# Patient Record
Sex: Male | Born: 2000 | Race: Black or African American | Hispanic: No | Marital: Single | Smoking: Never smoker
Health system: Southern US, Community
[De-identification: ages and names within clinical notes are randomized; demographics above are authoritative.]

## PROBLEM LIST (undated history)

## (undated) DIAGNOSIS — I10 Essential (primary) hypertension: Secondary | ICD-10-CM

## (undated) DIAGNOSIS — M79673 Pain in unspecified foot: Secondary | ICD-10-CM

## (undated) DIAGNOSIS — M25569 Pain in unspecified knee: Secondary | ICD-10-CM

## (undated) HISTORY — DX: Essential (primary) hypertension: I10

## (undated) HISTORY — DX: Pain in unspecified foot: M79.673

## (undated) HISTORY — DX: Pain in unspecified knee: M25.569

---
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
## 2020-02-20 ENCOUNTER — Other Ambulatory Visit: Payer: Self-pay | Admitting: Adult Medicine

## 2020-02-20 DIAGNOSIS — S060X9A Concussion with loss of consciousness of unspecified duration, initial encounter: Secondary | ICD-10-CM

## 2020-02-20 DIAGNOSIS — M542 Cervicalgia: Secondary | ICD-10-CM

## 2020-03-09 ENCOUNTER — Other Ambulatory Visit: Payer: Self-pay

## 2020-04-27 ENCOUNTER — Ambulatory Visit: Payer: Self-pay | Admitting: Family

## 2021-02-22 ENCOUNTER — Encounter: Payer: Self-pay | Admitting: Family

## 2021-02-22 ENCOUNTER — Ambulatory Visit (INDEPENDENT_AMBULATORY_CARE_PROVIDER_SITE_OTHER): Payer: 59 | Admitting: Family

## 2021-02-22 VITALS — BP 138/92 | HR 74 | Temp 97.3°F | Ht 74.0 in | Wt 174.0 lb

## 2021-02-22 DIAGNOSIS — Z7689 Persons encountering health services in other specified circumstances: Secondary | ICD-10-CM | POA: Diagnosis not present

## 2021-02-22 DIAGNOSIS — Z23 Encounter for immunization: Secondary | ICD-10-CM

## 2021-02-22 DIAGNOSIS — I10 Essential (primary) hypertension: Secondary | ICD-10-CM | POA: Diagnosis not present

## 2021-02-22 NOTE — Progress Notes (Signed)
Provider: Marlowe Sax Holland   No primary care provider on file.  No care team member to display  Extended Emergency Contact Information Primary Emergency Contact: [facility] Address: [address]          [place],  [place] Phone: [phone] Relation: Mother Secondary Emergency Contact: Gregory Holland Mobile Phone: [phone] Relation: Father Interpreter needed? No  Code Status:  Full Code  Goals of care: Advanced Directive information No flowsheet data found.   Chief Complaint  Patient presents with   Establish Care    New patient establish care. Elevated blood pressure and flu vaccine toady     HPI:  Pt is a 20 y.o. male seen today to establish care at [facility] for medical management of chronic diseases.He is a full time student at [employer].states Blood pressure was recent high 150/90.denies any headache,dizziness,vision changes,fatigue,chest tightness,palpitation,chest pain or shortness of breath.    Overall has been health does not take any medication on a regular basis. Does exercise by lifting weights though has been busy at school not exercising as usual.  He is due for Influenza vaccine. Also due for 2nd booster vaccine for COVID-19.discussed getting vaccine at his pharmacy.  States had patchy area on left neck which has resolved.  He drinks 2-3 beers / Tequila sometimes per week but not often.He does not smoke or use any illicit drugs.  Has had some sensation all over the body sometimes but none today.     Past Medical History:  Diagnosis Date   Foot pain    Per [facility] New Patient Packet   High blood pressure    Per [facility] New Patient Packet   Knee pain    Per [facility] New Patient packet   History reviewed. No pertinent surgical history.  Allergies  Allergen Reactions   Amoxicillin-Pot Clavulanate Rash    Allergies as of 02/22/2021       Reactions   Amoxicillin-pot Clavulanate Rash        Medication List        Accurate as of  February 22, 2021  1:29 PM. If you have any questions, ask your nurse or doctor.          ibuprofen 200 MG tablet Commonly known as: ADVIL Take 200 mg by mouth as needed.   multivitamin tablet Take 1 tablet by mouth daily.        Review of Systems  Constitutional:  Negative for appetite change, chills, fatigue, fever and unexpected weight change.  HENT:  Negative for congestion, dental problem, ear discharge, ear pain, facial swelling, hearing loss, nosebleeds, postnasal drip, rhinorrhea, sinus pressure, sinus pain, sneezing, sore throat, tinnitus and trouble swallowing.   Eyes:  Negative for pain, discharge, redness, itching and visual disturbance.  Respiratory:  Negative for cough, chest tightness, shortness of breath and wheezing.   Cardiovascular:  Negative for chest pain, palpitations and leg swelling.  Gastrointestinal:  Negative for abdominal distention, abdominal pain, blood in stool, constipation, diarrhea, nausea and vomiting.  Endocrine: Negative for cold intolerance, heat intolerance, polydipsia, polyphagia and polyuria.  Genitourinary:  Negative for difficulty urinating, dysuria, flank pain, frequency and urgency.  Musculoskeletal:  Negative for arthralgias, back pain, gait problem, joint swelling, myalgias, neck pain and neck stiffness.       Hx of right knee and foot pain when he used to play sports but has resolved.   Skin:  Negative for color change, pallor, rash and wound.       Reports  left neck patchy rash but has resolved   Neurological:  Negative for dizziness, syncope, speech difficulty, weakness, light-headedness, numbness and headaches.       Sensation all over the body but none today   Hematological:  Does not bruise/bleed easily.  Psychiatric/Behavioral:  Negative for agitation, behavioral problems, confusion, hallucinations, self-injury, sleep disturbance and suicidal ideas. The patient is not nervous/anxious.    Immunization History  Administered  Date(s) Administered   DTaP 03/21/1999, 05/27/1999, 07/15/1999, 07/16/2000, 01/28/2003   H1N1 03/16/2008   HPV Quadrivalent 05/10/2011, 07/17/2011, 12/18/2011   Hepatitis A, Ped/Adol-2 Dose 04/23/2006, 04/29/2007   Hepatitis B, ped/adol 05-May-1999, 02/15/1999, 10/21/1999   HiB (PRP-T) 03/21/1999, 05/27/1999, 07/15/1999, 01/23/2000   IPV 03/21/1999, 05/27/1999, 07/15/1999, 01/28/2003   Influenza Nasal 02/10/2010, 05/09/2012, 04/30/2013   Influenza Split 01/27/2015   Influenza,inj,Quad PF,6+ Mos 02/04/2016, 02/05/2017   MMR 01/23/2000, 01/28/2003   Meningococcal Conjugate 05/02/2010, 01/27/2015   PFIZER(Purple Top)SARS-COV-2 Vaccination 07/29/2019, 08/15/2019, 04/15/2020   Pneumococcal Conjugate-13 01/23/2000, 07/16/2000   Tdap 02/03/2009, 02/02/2020   Varicella 01/23/2000, 04/23/2006   Pertinent  Health Maintenance Due  Topic Date Due   INFLUENZA VACCINE  12/13/2020   Fall Risk  02/22/2021  Falls in the past year? 0  Number falls in past yr: 0  Injury with Fall? 0  Risk for fall due to : No Fall Risks  Follow up Falls evaluation completed   Functional Status Survey:    Vitals:   02/22/21 1318  BP: (!) 138/92  Pulse: 74  Temp: (!) 97.3 F (36.3 C)  TempSrc: Temporal  SpO2: 98%  Weight: 174 lb (78.9 kg)  Height: _0  (1.88 m)   Body mass index is 22.34 kg/m. Physical Exam Vitals reviewed.  Constitutional:      General: He is not in acute distress.    Appearance: Normal appearance. He is normal weight. He is not ill-appearing or diaphoretic.  HENT:     Head: Normocephalic.     Right Ear: Tympanic membrane, ear canal and external ear normal. There is no impacted cerumen.     Left Ear: Tympanic membrane, ear canal and external ear normal. There is no impacted cerumen.     Nose: Nose normal. No congestion or rhinorrhea.     Mouth/Throat:     Mouth: Mucous membranes are moist.     Pharynx: Oropharynx is clear. No oropharyngeal exudate or posterior oropharyngeal  erythema.  Eyes:     General: No scleral icterus.       Right eye: No discharge.        Left eye: No discharge.     Extraocular Movements: Extraocular movements intact.     Conjunctiva/sclera: Conjunctivae normal.     Pupils: Pupils are equal, round, and reactive to light.  Neck:     Vascular: No carotid bruit.  Cardiovascular:     Rate and Rhythm: Normal rate and regular rhythm.     Pulses: Normal pulses.     Heart sounds: Normal heart sounds. No murmur heard.   No friction rub. No gallop.  Pulmonary:     Effort: Pulmonary effort is normal. No respiratory distress.     Breath sounds: Normal breath sounds. No wheezing, rhonchi or rales.  Chest:     Chest wall: No tenderness.  Abdominal:     General: Bowel sounds are normal. There is no distension.     Palpations: Abdomen is soft. There is no mass.     Tenderness: There is no abdominal tenderness. There is no right  CVA tenderness, left CVA tenderness, guarding or rebound.  Musculoskeletal:        General: No swelling or tenderness. Normal range of motion.     Cervical back: Normal range of motion. No rigidity or tenderness.     Right lower leg: No edema.     Left lower leg: No edema.  Lymphadenopathy:     Cervical: No cervical adenopathy.  Skin:    General: Skin is warm and dry.     Coloration: Skin is not pale.     Findings: No bruising, erythema, lesion or rash.  Neurological:     Mental Status: He is alert and oriented to person, place, and time.     Cranial Nerves: No cranial nerve deficit.     Sensory: No sensory deficit.     Motor: No weakness.     Coordination: Coordination normal.     Gait: Gait normal.  Psychiatric:        Mood and Affect: Mood normal.        Speech: Speech normal.        Behavior: Behavior normal.        Thought Content: Thought content normal.        Judgment: Judgment normal.    Labs reviewed: No results for input(s): NA, K, CL, CO2, GLUCOSE, BUN, CREATININE, CALCIUM, MG, PHOS in the last  8760 hours. No results for input(s): AST, ALT, ALKPHOS, BILITOT, PROT, ALBUMIN in the last 8760 hours. No results for input(s): WBC, NEUTROABS, HGB, HCT, MCV, PLT in the last 8760 hours. No results found for: TSH No results found for: HGBA1C No results found for: CHOL, HDL, LDLCALC, LDLDIRECT, TRIG, CHOLHDL  Significant Diagnostic Results in last 30 days:  No results found.  Assessment/Plan There are no diagnoses linked to this encounter.   Family/ staff Communication: Reviewed plan of care with patient verbalized understanding.  Labs/tests ordered:   1. Encounter to establish care Immunization reviewed up to date except for 2nd COVID-19 booster and Influenza vaccine.Recommended fasting labs to be done today came fasting.  - CBC with Differential/Platelet - CMP with eGFR([facility]) - Lipid Panel  2. Essential hypertension Reports elevated reading recent but has improved today. Will have him monitor B/p at home then notify provider if B/p consistently > 140/90  Additional DASH Eating plan Education information provided on AVS  - CBC with Differential/Platelet - CMP with eGFR([facility]) - Lipid Panel - TSH  3. Need for influenza vaccination Afebrile. Flut shot administered by Annitta Jersey CMA no acute reaction reported.   - Flu Vaccine QUAD 6+ mos PF IM (Fluarix Quad PF)   Next Appointment : one year for Annual Physical examination with fasting labs.  Sandrea Hughs, NP

## 2021-02-22 NOTE — Patient Instructions (Addendum)
Labs done today will call you with results.  -  Please check Blood pressure at home and record on log provided and notify provider if B/p > 140/90 for three consecutive readings   DASH Eating Plan DASH stands for Dietary Approaches to Stop Hypertension. The DASH eating plan is a healthy eating plan that has been shown to: Reduce high blood pressure (hypertension). Reduce your risk for type 2 diabetes, heart disease, and stroke. Help with weight loss. What are tips for following this plan? Reading food labels Check food labels for the amount of salt (sodium) per serving. Choose foods with less than 5 percent of the Daily Value of sodium. Generally, foods with less than 300 milligrams (mg) of sodium per serving fit into this eating plan. To find whole grains, look for the word "whole" as the first word in the ingredient list. Shopping Buy products labeled as "low-sodium" or "no salt added." Buy fresh foods. Avoid canned foods and pre-made or frozen meals. Cooking Avoid adding salt when cooking. Use salt-free seasonings or herbs instead of table salt or sea salt. Check with your health care provider or pharmacist before using salt substitutes. Do not fry foods. Cook foods using healthy methods such as baking, boiling, grilling, roasting, and broiling instead. Cook with heart-healthy oils, such as olive, canola, avocado, soybean, or sunflower oil. Meal planning  Eat a balanced diet that includes: 4 or more servings of fruits and 4 or more servings of vegetables each day. Try to fill one-half of your plate with fruits and vegetables. 6-8 servings of whole grains each day. Less than 6 oz (170 g) of lean meat, poultry, or fish each day. A 3-oz (85-g) serving of meat is about the same size as a deck of cards. One egg equals 1 oz (28 g). 2-3 servings of low-fat dairy each day. One serving is 1 cup (237 mL). 1 serving of nuts, seeds, or beans 5 times each week. 2-3 servings of heart-healthy fats.  Healthy fats called omega-3 fatty acids are found in foods such as walnuts, flaxseeds, fortified milks, and eggs. These fats are also found in cold-water fish, such as sardines, salmon, and mackerel. Limit how much you eat of: Canned or prepackaged foods. Food that is high in trans fat, such as some fried foods. Food that is high in saturated fat, such as fatty meat. Desserts and other sweets, sugary drinks, and other foods with added sugar. Full-fat dairy products. Do not salt foods before eating. Do not eat more than 4 egg yolks a week. Try to eat at least 2 vegetarian meals a week. Eat more home-cooked food and less restaurant, buffet, and fast food. Lifestyle When eating at a restaurant, ask that your food be prepared with less salt or no salt, if possible. If you drink alcohol: Limit how much you use to: 0-1 drink a day for women who are not pregnant. 0-2 drinks a day for men. Be aware of how much alcohol is in your drink. In the [place] one drink equals one 12 oz bottle of beer (355 mL), one 5 oz glass of wine (148 mL), or one 1 oz glass of hard liquor (44 mL). General information Avoid eating more than 2,300 mg of salt a day. If you have hypertension, you may need to reduce your sodium intake to 1,500 mg a day. Work with your health care provider to maintain a healthy body weight or to lose weight. Ask what an ideal weight is for you. Get at  least 30 minutes of exercise that causes your heart to beat faster (aerobic exercise) most days of the week. Activities may include walking, swimming, or biking. Work with your health care provider or dietitian to adjust your eating plan to your individual calorie needs. What foods should I eat? Fruits All fresh, dried, or frozen fruit. Canned fruit in natural juice (without added sugar). Vegetables Fresh or frozen vegetables (raw, steamed, roasted, or grilled). Low-sodium or reduced-sodium tomato and vegetable juice. Low-sodium or reduced-sodium  tomato sauce and tomato paste. Low-sodium or reduced-sodium canned vegetables. Grains Whole-grain or whole-wheat bread. Whole-grain or whole-wheat pasta. Klinker rice. Orpah Cobb. Bulgur. Whole-grain and low-sodium cereals. Pita bread. Low-fat, low-sodium crackers. Whole-wheat flour tortillas. Meats and other proteins Skinless chicken or [place]. Ground chicken or [place]. Pork with fat trimmed off. Fish and seafood. Egg whites. Dried beans, peas, or lentils. Unsalted nuts, nut butters, and seeds. Unsalted canned beans. Lean cuts of beef with fat trimmed off. Low-sodium, lean precooked or cured meat, such as sausages or meat loaves. Dairy Low-fat (1%) or fat-free (skim) milk. Reduced-fat, low-fat, or fat-free cheeses. Nonfat, low-sodium ricotta or cottage cheese. Low-fat or nonfat yogurt. Low-fat, low-sodium cheese. Fats and oils Soft margarine without trans fats. Vegetable oil. Reduced-fat, low-fat, or light mayonnaise and salad dressings (reduced-sodium). Canola, safflower, olive, avocado, soybean, and sunflower oils. Avocado. Seasonings and condiments Herbs. Spices. Seasoning mixes without salt. Other foods Unsalted popcorn and pretzels. Fat-free sweets. The items listed above may not be a complete list of foods and beverages you can eat. Contact a dietitian for more information. What foods should I avoid? Fruits Canned fruit in a light or heavy syrup. Fried fruit. Fruit in cream or butter sauce. Vegetables Creamed or fried vegetables. Vegetables in a cheese sauce. Regular canned vegetables (not low-sodium or reduced-sodium). Regular canned tomato sauce and paste (not low-sodium or reduced-sodium). Regular tomato and vegetable juice (not low-sodium or reduced-sodium). Rosita Fire. Olives. Grains Baked goods made with fat, such as croissants, muffins, or some breads. Dry pasta or rice meal packs. Meats and other proteins Fatty cuts of meat. Ribs. Fried meat. Tomasa Blase. Bologna, salami, and other  precooked or cured meats, such as sausages or meat loaves. Fat from the back of a pig (fatback). Bratwurst. Salted nuts and seeds. Canned beans with added salt. Canned or smoked fish. Whole eggs or egg yolks. Chicken or [place] with skin. Dairy Whole or 2% milk, cream, and half-and-half. Whole or full-fat cream cheese. Whole-fat or sweetened yogurt. Full-fat cheese. Nondairy creamers. Whipped toppings. Processed cheese and cheese spreads. Fats and oils Butter. Stick margarine. Lard. Shortening. Ghee. Bacon fat. Tropical oils, such as coconut, palm kernel, or palm oil. Seasonings and condiments Onion salt, garlic salt, seasoned salt, table salt, and sea salt. Worcestershire sauce. Tartar sauce. Barbecue sauce. Teriyaki sauce. Soy sauce, including reduced-sodium. Steak sauce. Canned and packaged gravies. Fish sauce. Oyster sauce. Cocktail sauce. Store-bought horseradish. Ketchup. Mustard. Meat flavorings and tenderizers. Bouillon cubes. Hot sauces. Pre-made or packaged marinades. Pre-made or packaged taco seasonings. Relishes. Regular salad dressings. Other foods Salted popcorn and pretzels. The items listed above may not be a complete list of foods and beverages you should avoid. Contact a dietitian for more information. Where to find more information National Heart, Lung, and Blood Institute: PopSteam.is American Heart Association: www.heart.org Academy of Nutrition and Dietetics: www.eatright.org National Kidney Foundation: www.kidney.org Summary The DASH eating plan is a healthy eating plan that has been shown to reduce high blood pressure (hypertension). It may also reduce your  risk for type 2 diabetes, heart disease, and stroke. When on the DASH eating plan, aim to eat more fresh fruits and vegetables, whole grains, lean proteins, low-fat dairy, and heart-healthy fats. With the DASH eating plan, you should limit salt (sodium) intake to 2,300 mg a day. If you have hypertension, you may need  to reduce your sodium intake to 1,500 mg a day. Work with your health care provider or dietitian to adjust your eating plan to your individual calorie needs. This information is not intended to replace advice given to you by your health care provider. Make sure you discuss any questions you have with your health care provider. Document Revised: 04/04/2019 Document Reviewed: 04/04/2019 Elsevier Patient Education  2022 [employer].

## 2021-02-23 LAB — COMPLETE METABOLIC PANEL WITH GFR
AG Ratio: 1.4 (calc) (ref 1.0–2.5)
ALT: 11 U/L (ref 9–46)
AST: 16 U/L (ref 10–40)
Albumin: 4 g/dL (ref 3.6–5.1)
Alkaline phosphatase (APISO): 38 U/L (ref 36–130)
BUN: 13 mg/dL (ref 7–25)
CO2: 29 mmol/L (ref 20–32)
Calcium: 9.3 mg/dL (ref 8.6–10.3)
Chloride: 103 mmol/L (ref 98–110)
Creat: 1.06 mg/dL (ref 0.60–1.24)
Globulin: 2.8 g/dL (calc) (ref 1.9–3.7)
Glucose, Bld: 80 mg/dL (ref 65–99)
Potassium: 4.7 mmol/L (ref 3.5–5.3)
Sodium: 140 mmol/L (ref 135–146)
Total Bilirubin: 0.6 mg/dL (ref 0.2–1.2)
Total Protein: 6.8 g/dL (ref 6.1–8.1)
eGFR: 102 mL/min/{1.73_m2} (ref >=60)

## 2021-02-23 LAB — CBC WITH DIFFERENTIAL/PLATELET
Absolute Monocytes: 164 cells/uL — ABNORMAL LOW (ref 200–950)
Basophils Absolute: 21 cells/uL (ref 0–200)
Basophils Relative: 0.8 %
Eosinophils Absolute: 31 cells/uL (ref 15–500)
Eosinophils Relative: 1.2 %
HCT: 43.5 % (ref 38.5–50.0)
Hemoglobin: 14.4 g/dL (ref 13.2–17.1)
Lymphs Abs: 1326 cells/uL (ref 850–3900)
MCH: 29.2 pg (ref 27.0–33.0)
MCHC: 33.1 g/dL (ref 32.0–36.0)
MCV: 88.2 fL (ref 80.0–100.0)
MPV: 9.3 fL (ref 7.5–12.5)
Monocytes Relative: 6.3 %
Neutro Abs: 1058 cells/uL — ABNORMAL LOW (ref 1500–7800)
Neutrophils Relative %: 40.7 %
Platelets: 399 10*3/uL (ref 140–400)
RBC: 4.93 10*6/uL (ref 4.20–5.80)
RDW: 11.3 % (ref 11.0–15.0)
Total Lymphocyte: 51 %
WBC: 2.6 10*3/uL — ABNORMAL LOW (ref 3.8–10.8)

## 2021-02-23 LAB — LIPID PANEL
Cholesterol: 123 mg/dL (ref <200)
HDL: 45 mg/dL (ref >=40)
LDL Cholesterol (Calc): 65 mg/dL (calc)
Non-HDL Cholesterol (Calc): 78 mg/dL (calc) (ref <130)
Total CHOL/HDL Ratio: 2.7 (calc) (ref <5.0)
Triglycerides: 47 mg/dL (ref <150)

## 2021-02-23 LAB — TSH: TSH: 0.88 mIU/L (ref 0.40–4.50)

## 2021-11-11 NOTE — Patient Instructions (Signed)
Please contact your local pharmacy, previous provider, or insurance carrier for vaccine/immunization records. Ensure that any procedures done outside of [facility] are faxed to [place] [phone] or you can sign release of records form at the front desk to keep your medical record updated.   Health Maintenance, Male Adopting a healthy lifestyle and getting preventive care are important in promoting health and wellness. Ask your health care provider about: The right schedule for you to have regular tests and exams. Things you can do on your own to prevent diseases and keep yourself healthy. What should I know about diet, weight, and exercise? Eat a healthy diet  Eat a diet that includes plenty of vegetables, fruits, low-fat dairy products, and lean protein. Do not eat a lot of foods that are high in solid fats, added sugars, or sodium. Maintain a healthy weight Body mass index (BMI) is a measurement that can be used to identify possible weight problems. It estimates body fat based on height and weight. Your health care provider can help determine your BMI and help you achieve or maintain a healthy weight. Get regular exercise Get regular exercise. This is one of the most important things you can do for your health. Most adults should: Exercise for at least 150 minutes each week. The exercise should increase your heart rate and make you sweat (moderate-intensity exercise). Do strengthening exercises at least twice a week. This is in addition to the moderate-intensity exercise. Spend less time sitting. Even light physical activity can be beneficial. Watch cholesterol and blood lipids Have your blood tested for lipids and cholesterol at 23 years of age, then have this test every 5 years. You may need to have your cholesterol levels checked more often if: Your lipid or cholesterol levels are high. You are older than 23 years of age. You are at high risk for heart  disease. What should I know about cancer screening? Many types of cancers can be detected early and may often be prevented. Depending on your health history and family history, you may need to have cancer screening at various ages. This may include screening for: Colorectal cancer. Prostate cancer. Skin cancer. Lung cancer. What should I know about heart disease, diabetes, and high blood pressure? Blood pressure and heart disease High blood pressure causes heart disease and increases the risk of stroke. This is more likely to develop in people who have high blood pressure readings or are overweight. Talk with your health care provider about your target blood pressure readings. Have your blood pressure checked: Every 3-5 years if you are 38-51 years of age. Every year if you are 33 years old or older. If you are between the ages of 31 and 8 and are a current or former smoker, ask your health care provider if you should have a one-time screening for abdominal aortic aneurysm (AAA). Diabetes Have regular diabetes screenings. This checks your fasting blood sugar level. Have the screening done: Once every three years after age 43 if you are at a normal weight and have a low risk for diabetes. More often and at a younger age if you are overweight or have a high risk for diabetes. What should I know about preventing infection? Hepatitis B If you have a higher risk for hepatitis B, you should be screened for this virus. Talk with your health care provider to find out if you are at risk for hepatitis B infection. Hepatitis C Blood testing is recommended for: Everyone  born from 1945 through 1965. Anyone with known risk factors for hepatitis C. Sexually transmitted infections (STIs) You should be screened each year for STIs, including gonorrhea and chlamydia, if: You are sexually active and are younger than 24 years of age. You are older than 24 years of age and your health care provider tells you  that you are at risk for this type of infection. Your sexual activity has changed since you were last screened, and you are at increased risk for chlamydia or gonorrhea. Ask your health care provider if you are at risk. Ask your health care provider about whether you are at high risk for HIV. Your health care provider may recommend a prescription medicine to help prevent HIV infection. If you choose to take medicine to prevent HIV, you should first get tested for HIV. You should then be tested every 3 months for as long as you are taking the medicine. Follow these instructions at home: Alcohol use Do not drink alcohol if your health care provider tells you not to drink. If you drink alcohol: Limit how much you have to 0-2 drinks a day. Know how much alcohol is in your drink. In the [place] one drink equals one 12 oz bottle of beer (355 mL), one 5 oz glass of wine (148 mL), or one 1 oz glass of hard liquor (44 mL). Lifestyle Do not use any products that contain nicotine or tobacco. These products include cigarettes, chewing tobacco, and vaping devices, such as e-cigarettes. If you need help quitting, ask your health care provider. Do not use street drugs. Do not share needles. Ask your health care provider for help if you need support or information about quitting drugs. General instructions Schedule regular health, dental, and eye exams. Stay current with your vaccines. Tell your health care provider if: You often feel depressed. You have ever been abused or do not feel safe at home. Summary Adopting a healthy lifestyle and getting preventive care are important in promoting health and wellness. Follow your health care provider's instructions about healthy diet, exercising, and getting tested or screened for diseases. Follow your health care provider's instructions on monitoring your cholesterol and blood pressure. This information is not intended to replace advice given to you by your health  care provider. Make sure you discuss any questions you have with your health care provider. Document Revised: 09/20/2020 Document Reviewed: 09/20/2020 Elsevier Patient Education  2023 Elsevier Inc.  

## 2021-11-14 ENCOUNTER — Encounter: Payer: Self-pay | Admitting: Family

## 2021-11-14 ENCOUNTER — Ambulatory Visit (INDEPENDENT_AMBULATORY_CARE_PROVIDER_SITE_OTHER): Payer: 59 | Admitting: Family

## 2021-11-14 VITALS — BP 150/80 | HR 71 | Temp 97.7°F | Resp 18 | Ht 74.0 in | Wt 178.0 lb

## 2021-11-14 DIAGNOSIS — Z13220 Encounter for screening for lipoid disorders: Secondary | ICD-10-CM

## 2021-11-14 DIAGNOSIS — Z0000 Encounter for general adult medical examination without abnormal findings: Secondary | ICD-10-CM | POA: Diagnosis not present

## 2021-11-14 DIAGNOSIS — Z1159 Encounter for screening for other viral diseases: Secondary | ICD-10-CM | POA: Diagnosis not present

## 2021-11-14 DIAGNOSIS — I10 Essential (primary) hypertension: Secondary | ICD-10-CM | POA: Diagnosis not present

## 2021-11-14 DIAGNOSIS — Z113 Encounter for screening for infections with a predominantly sexual mode of transmission: Secondary | ICD-10-CM | POA: Diagnosis not present

## 2021-11-14 MED ORDER — ACETAMINOPHEN 500 MG PO TABS: 500.0000 mg | ORAL_TABLET | Freq: Four times a day (QID) | ORAL | 0 refills | Status: AC | PRN

## 2021-11-14 MED ORDER — AMLODIPINE BESYLATE 5 MG PO TABS: 5.0000 mg | ORAL_TABLET | Freq: Every day | ORAL | 0 refills | Status: DC

## 2021-11-14 NOTE — Progress Notes (Signed)
Provider: Marlowe Sax Holland   Gregory Holland, Gregory Bucks, NP  Patient Care Team: Christion Leonhard, Gregory Bucks, NP as PCP - General (Family Medicine)  Extended Emergency Contact Information Primary Emergency Contact: [facility] Address: [address]          [place],  [place] Phone: [phone] Relation: Mother Secondary Emergency Contact: Burroughs,Clyde Mobile Phone: [phone] Relation: Father Interpreter needed? No  Code Status:  Full Code  Goals of care: Advanced Directive information    11/14/2021   11:52 AM  Advanced Directives  Does Patient Have a Medical Advance Directive? No  Would patient like information on creating a medical advance directive? No - Patient declined     Chief Complaint  Patient presents with   Annual Exam    PHYSICAL PT has BP log   Health Maintenance    Discuss the need for HIV Screening, and Hepatitis C Screening.   Immunizations    Discuss the need for [employer].    HPI:  Pt is a 21 y.o. male seen today for annual Physical examination.He denies any acute issues during visit. He brought his home blood pressure log readings ranging in 120's/70's with most readings ranging in the 130's/90's - 150's/80's.denies any headache,dizziness,vision changes,fatigue,chest tightness,palpitation,chest pain or shortness of breath.   Health maintenance:  Due for COVID-19 booster vaccine advised to get vaccine at the pharmacy. For HIV and hepatitis C screening.Agrees to get labs done with next lab work     Past Medical History:  Diagnosis Date   Foot pain    Per [facility] Patient Packet   High blood pressure    Per [facility] New Patient Packet   Knee pain    Per [facility] New Patient packet   History reviewed. No pertinent surgical history.  Allergies  Allergen Reactions   Amoxicillin-Pot Clavulanate Rash    Allergies as of 11/14/2021       Reactions   Amoxicillin-pot Clavulanate Rash        Medication List        Accurate as of November 14, 2021  2:57  PM. If you have any questions, ask your nurse or doctor.          STOP taking these medications    ibuprofen 200 MG tablet Commonly known as: ADVIL Stopped by: Sandrea Hughs, NP       TAKE these medications    acetaminophen 500 MG tablet Commonly known as: TYLENOL Take 1 tablet (500 mg total) by mouth every 6 (six) hours as needed. Started by: Sandrea Hughs, NP   multivitamin tablet Take 1 tablet by mouth daily.        Review of Systems  Constitutional:  Negative for appetite change, chills, fatigue, fever and unexpected weight change.  HENT:  Negative for congestion, dental problem, ear discharge, ear pain, facial swelling, hearing loss, nosebleeds, postnasal drip, rhinorrhea, sinus pressure, sinus pain, sneezing, sore throat, tinnitus and trouble swallowing.   Eyes:  Negative for pain, discharge, redness, itching and visual disturbance.  Respiratory:  Negative for cough, chest tightness, shortness of breath and wheezing.   Cardiovascular:  Negative for chest pain, palpitations and leg swelling.  Gastrointestinal:  Negative for abdominal distention, abdominal pain, blood in stool, constipation, diarrhea, nausea and vomiting.  Endocrine: Negative for cold intolerance, heat intolerance, polydipsia, polyphagia and polyuria.  Genitourinary:  Negative for difficulty urinating, dysuria, flank pain, frequency and urgency.  Musculoskeletal:  Negative for arthralgias, back pain, gait problem, joint swelling, myalgias, neck pain and  neck stiffness.  Skin:  Negative for color change, pallor, rash and wound.  Neurological:  Negative for dizziness, syncope, speech difficulty, weakness, light-headedness, numbness and headaches.  Hematological:  Does not bruise/bleed easily.  Psychiatric/Behavioral:  Negative for agitation, behavioral problems, confusion, hallucinations, self-injury, sleep disturbance and suicidal ideas. The patient is not nervous/anxious.     Immunization History   Administered Date(s) Administered   DTaP 03/21/1999, 05/27/1999, 07/15/1999, 07/16/2000, 01/28/2003   H1N1 03/16/2008   HPV Quadrivalent 05/10/2011, 07/17/2011, 12/18/2011   Hepatitis A, Ped/Adol-2 Dose 04/23/2006, 04/29/2007   Hepatitis B, ped/adol 04/26/1999, 02/15/1999, 10/21/1999   HiB (PRP-T) 03/21/1999, 05/27/1999, 07/15/1999, 01/23/2000   IPV 03/21/1999, 05/27/1999, 07/15/1999, 01/28/2003   Influenza Nasal 02/10/2010, 05/09/2012, 04/30/2013   Influenza Split 01/27/2015   Influenza,inj,Quad PF,6+ Mos 02/04/2016, 02/05/2017, 02/22/2021   MMR 01/23/2000, 01/28/2003   Meningococcal Conjugate 05/02/2010, 01/27/2015   PFIZER(Purple Top)SARS-COV-2 Vaccination 07/29/2019, 08/15/2019, 04/15/2020, 02/26/2021   Pneumococcal Conjugate-13 01/23/2000, 07/16/2000   Tdap 02/03/2009, 02/02/2020   Varicella 01/23/2000, 04/23/2006   Pertinent  Health Maintenance Due  Topic Date Due   INFLUENZA VACCINE  12/13/2021      02/22/2021    1:18 PM 11/14/2021   11:53 AM  [place] in the past year? 0 0  Was there an injury with Fall? 0 0  Fall Risk Category Calculator 0 0  Fall Risk Category Low Low  Patient Fall Risk Level Low fall risk Low fall risk  Patient at Risk for Falls Due to No Fall Risks No Fall Risks  Fall risk Follow up Falls evaluation completed Falls evaluation completed   Functional Status Survey:    Vitals:   11/14/21 1427  BP: (!) 150/80  Pulse: 71  Resp: 18  Temp: 97.7 F (36.5 C)  TempSrc: Temporal  SpO2: 98%  Weight: 178 lb (80.7 kg)  Height: _0  (1.88 m)   Body mass index is 22.85 kg/m. Physical Exam Vitals reviewed.  Constitutional:      General: He is not in acute distress.    Appearance: Normal appearance. He is normal weight. He is not ill-appearing or diaphoretic.  HENT:     Head: Normocephalic.     Right Ear: Tympanic membrane, ear canal and external ear normal. There is no impacted cerumen.     Left Ear: Tympanic membrane, ear canal and  external ear normal. There is no impacted cerumen.     Nose: Nose normal. No congestion or rhinorrhea.     Mouth/Throat:     Mouth: Mucous membranes are moist.     Pharynx: Oropharynx is clear. No oropharyngeal exudate or posterior oropharyngeal erythema.  Eyes:     General: No scleral icterus.       Right eye: No discharge.        Left eye: No discharge.     Extraocular Movements: Extraocular movements intact.     Conjunctiva/sclera: Conjunctivae normal.     Pupils: Pupils are equal, round, and reactive to light.  Neck:     Vascular: No carotid bruit.  Cardiovascular:     Rate and Rhythm: Normal rate and regular rhythm.     Pulses: Normal pulses.     Heart sounds: Normal heart sounds. No murmur heard.    No friction rub. No gallop.  Pulmonary:     Effort: Pulmonary effort is normal. No respiratory distress.     Breath sounds: Normal breath sounds. No wheezing, rhonchi or rales.  Chest:     Chest wall: No tenderness.  Abdominal:     General: Bowel sounds are normal. There is no distension.     Palpations: Abdomen is soft. There is no mass.     Tenderness: There is no abdominal tenderness. There is no right CVA tenderness, left CVA tenderness, guarding or rebound.  Musculoskeletal:        General: No swelling or tenderness. Normal range of motion.     Cervical back: Normal range of motion. No rigidity or tenderness.     Right lower leg: No edema.     Left lower leg: No edema.  Lymphadenopathy:     Cervical: No cervical adenopathy.  Skin:    General: Skin is warm and dry.     Coloration: Skin is not pale.     Findings: No bruising, erythema, lesion or rash.  Neurological:     Mental Status: He is alert and oriented to person, place, and time.     Cranial Nerves: No cranial nerve deficit.     Sensory: No sensory deficit.     Motor: No weakness.     Coordination: Coordination normal.     Gait: Gait normal.  Psychiatric:        Mood and Affect: Mood normal.        Speech:  Speech normal.        Behavior: Behavior normal.        Thought Content: Thought content normal.        Judgment: Judgment normal.     Labs reviewed: Recent Labs    02/22/21 1413  NA 140  K 4.7  CL 103  CO2 29  GLUCOSE 80  BUN 13  CREATININE 1.06  CALCIUM 9.3   Recent Labs    02/22/21 1413  AST 16  ALT 11  BILITOT 0.6  PROT 6.8   Recent Labs    02/22/21 1413  WBC 2.6*  NEUTROABS 1,058*  HGB 14.4  HCT 43.5  MCV 88.2  PLT 399   Lab Results  Component Value Date   TSH 0.88 02/22/2021   No results found for: "HGBA1C" Lab Results  Component Value Date   CHOL 123 02/22/2021   HDL 45 02/22/2021   LDLCALC 65 02/22/2021   TRIG 47 02/22/2021   CHOLHDL 2.7 02/22/2021    Significant Diagnostic Results in last 30 days:  No results found.  Assessment/Plan 1. Annual physical exam  Immunization reviewed due COVID-19 booster vaccine. Medication and labs reviewed patient counselled regarding yearly exam, prevention of dental and periodontal disease, diet, regular sustained exercise for at least 30 minutes x 3 /week,recommended schedule for routine labs.  2. Screen for STD (sexually transmitted disease) No high risk behaviors reported - HIV Antibody (routine testing w rflx); Future  3. Encounter for hepatitis C screening test for low risk patient Low risk - Hep C Antibody; Future  4. Essential hypertension His home blood pressure readings elevated We will start on amlodipine 5 mg tablet 1 by mouth daily -  Advised to check Blood pressure at home and record on log provided and notify provider if B/p > 140/90  -Follow-up in 2 weeks to evaluate blood pressure - Lipid panel; Future - TSH; Future - COMPLETE METABOLIC PANEL WITH GFR; Future - CBC with Differential/Platelet; Future  5. Screening for hyperlipidemia Dietary modification and exercise advised - Lipid panel; Future  Family/ staff Communication: Reviewed plan of care with patient verbalized  understanding  Labs/tests ordered:  - CBC with Differential/Platelet - CMP with eGFR([facility]) - TSH -  Lipid panel - Hep C Antibody - HIV Antibody (routine testing w rflx)   Next Appointment : Return for annual Physical examination. 2 weeks f/u blood pressure and fasting lab work .   Sandrea Hughs, NP

## 2021-12-05 ENCOUNTER — Ambulatory Visit (INDEPENDENT_AMBULATORY_CARE_PROVIDER_SITE_OTHER): Payer: 59 | Admitting: Family

## 2021-12-05 ENCOUNTER — Encounter: Payer: Self-pay | Admitting: Family

## 2021-12-05 DIAGNOSIS — I10 Essential (primary) hypertension: Secondary | ICD-10-CM | POA: Diagnosis not present

## 2021-12-05 DIAGNOSIS — Z13220 Encounter for screening for lipoid disorders: Secondary | ICD-10-CM | POA: Diagnosis not present

## 2021-12-05 DIAGNOSIS — Z1159 Encounter for screening for other viral diseases: Secondary | ICD-10-CM | POA: Diagnosis not present

## 2021-12-05 DIAGNOSIS — Z113 Encounter for screening for infections with a predominantly sexual mode of transmission: Secondary | ICD-10-CM | POA: Diagnosis not present

## 2021-12-05 LAB — CBC WITH DIFFERENTIAL/PLATELET
Absolute Monocytes: 183 cells/uL — ABNORMAL LOW (ref 200–950)
Basophils Relative: 1.4 %
Eosinophils Absolute: 183 cells/uL (ref 15–500)
Eosinophils Relative: 8.3 %
HCT: 44.2 % (ref 38.5–50.0)
Lymphs Abs: 1065 cells/uL (ref 850–3900)
MCHC: 33.7 g/dL (ref 32.0–36.0)
MPV: 9.7 fL (ref 7.5–12.5)
Neutro Abs: 739 cells/uL — ABNORMAL LOW (ref 1500–7800)
Neutrophils Relative %: 33.6 %
Platelets: 302 10*3/uL (ref 140–400)
RBC: 5.07 10*6/uL (ref 4.20–5.80)
RDW: 11.4 % (ref 11.0–15.0)
Total Lymphocyte: 48.4 %

## 2021-12-05 MED ORDER — AMLODIPINE BESYLATE 5 MG PO TABS: 5.0000 mg | ORAL_TABLET | Freq: Every day | ORAL | 1 refills | Status: DC

## 2021-12-05 NOTE — Progress Notes (Signed)
Provider: Marlowe Sax FNP-C  Jaimen Melone, Nelda Bucks, NP  Patient Care Team: Philena Obey, Nelda Bucks, NP as PCP - General (Family Medicine)  Extended Emergency Contact Information Primary Emergency Contact: [facility] Address: [address]          [place],  [place] Phone: [phone] Relation: Mother Secondary Emergency Contact: Mcquerry,Clyde Mobile Phone: [phone] Relation: Father Interpreter needed? No  Code Status:  Full Code  Goals of care: Advanced Directive information    12/05/2021    9:41 AM  Advanced Directives  Does Patient Have a Medical Advance Directive? No  Would patient like information on creating a medical advance directive? No - Patient declined     Chief Complaint  Patient presents with   Follow-up    2 week blood pressure check.    Labs     Fasting Labs.     HPI:  Pt is a 21 y.o. male seen today for an acute visit for 2 weeks follow up for high blood pressure. States blood pressure has improved since he was started on amlodipine 5 mg tablet daily.denies any side effects. He brought his B/p log to visit today readings in the 130's/80's. He denies denies any headache,dizziness,vision changes,fatigue,chest tightness,palpitation,chest pain or shortness of breath.     Past Medical History:  Diagnosis Date   Foot pain    Per [facility] New Patient Packet   High blood pressure    Per [facility] New Patient Packet   Knee pain    Per [facility] New Patient packet   History reviewed. No pertinent surgical history.  Allergies  Allergen Reactions   Amoxicillin-Pot Clavulanate Rash    Outpatient Encounter Medications as of 12/05/2021  Medication Sig   acetaminophen (TYLENOL) 500 MG tablet Take 1 tablet (500 mg total) by mouth every 6 (six) hours as needed.   amLODipine (NORVASC) 5 MG tablet Take 1 tablet (5 mg total) by mouth daily.   Multiple Vitamin (MULTIVITAMIN) tablet Take 1 tablet by mouth daily.   No facility-administered encounter medications on file as of  12/05/2021.    Review of Systems  Constitutional:  Negative for appetite change, chills, fatigue, fever and unexpected weight change.  Eyes:  Negative for pain, discharge, redness, itching and visual disturbance.  Respiratory:  Negative for cough, chest tightness, shortness of breath and wheezing.   Cardiovascular:  Negative for chest pain, palpitations and leg swelling.  Gastrointestinal:  Negative for abdominal distention, abdominal pain, diarrhea, nausea and vomiting.  Endocrine: Negative for cold intolerance, heat intolerance, polydipsia, polyphagia and polyuria.  Genitourinary:  Negative for difficulty urinating, dysuria, flank pain, frequency and urgency.  Musculoskeletal:  Negative for arthralgias, back pain, gait problem, joint swelling, myalgias, neck pain and neck stiffness.  Skin:  Negative for color change, pallor, rash and wound.  Neurological:  Negative for dizziness, syncope, speech difficulty, weakness, light-headedness, numbness and headaches.  Hematological:  Does not bruise/bleed easily.  Psychiatric/Behavioral:  Negative for agitation, behavioral problems, confusion, hallucinations, self-injury, sleep disturbance and suicidal ideas. The patient is not nervous/anxious.     Immunization History  Administered Date(s) Administered   DTaP 03/21/1999, 05/27/1999, 07/15/1999, 07/16/2000, 01/28/2003   H1N1 03/16/2008   HPV Quadrivalent 05/10/2011, 07/17/2011, 12/18/2011   Hepatitis A, Ped/Adol-2 Dose 04/23/2006, 04/29/2007   Hepatitis B, ped/adol 01/03/99, 02/15/1999, 10/21/1999   HiB (PRP-T) 03/21/1999, 05/27/1999, 07/15/1999, 01/23/2000   IPV 03/21/1999, 05/27/1999, 07/15/1999, 01/28/2003   Influenza Nasal 02/10/2010, 05/09/2012, 04/30/2013   Influenza Split 01/27/2015   Influenza,inj,Quad PF,6+ Mos 02/04/2016, 02/05/2017, 02/22/2021  MMR 01/23/2000, 01/28/2003   Meningococcal Conjugate 05/02/2010, 01/27/2015   PFIZER(Purple Top)SARS-COV-2 Vaccination 07/29/2019,  08/15/2019, 04/15/2020, 02/26/2021   Pneumococcal Conjugate-13 01/23/2000, 07/16/2000   Tdap 02/03/2009, 02/02/2020   Varicella 01/23/2000, 04/23/2006   Pertinent  Health Maintenance Due  Topic Date Due   INFLUENZA VACCINE  12/13/2021      02/22/2021    1:18 PM 11/14/2021   11:53 AM 12/05/2021    9:41 AM  [place] in the past year? 0 0 0  Was there an injury with Fall? 0 0 0  Fall Risk Category Calculator 0 0 0  Fall Risk Category Low Low Low  Patient Fall Risk Level Low fall risk Low fall risk Low fall risk  Patient at Risk for Falls Due to No Fall Risks No Fall Risks No Fall Risks  Fall risk Follow up Falls evaluation completed Falls evaluation completed Falls evaluation completed   Functional Status Survey:    Vitals:   12/05/21 0939  BP: 130/90  Pulse: 60  Resp: 16  Temp: 98 F (36.7 C)  SpO2: 98%  Weight: 174 lb 12.8 oz (79.3 kg)  Height: 6' 2"  (1.88 m)   Body mass index is 22.44 kg/m. Physical Exam Vitals reviewed.  Constitutional:      General: He is not in acute distress.    Appearance: Normal appearance. He is normal weight. He is not ill-appearing or diaphoretic.  HENT:     Head: Normocephalic.     Nose: Nose normal. No congestion or rhinorrhea.     Mouth/Throat:     Mouth: Mucous membranes are moist.     Pharynx: Oropharynx is clear. No oropharyngeal exudate or posterior oropharyngeal erythema.  Eyes:     General: No scleral icterus.       Right eye: No discharge.        Left eye: No discharge.     Extraocular Movements: Extraocular movements intact.     Conjunctiva/sclera: Conjunctivae normal.     Pupils: Pupils are equal, round, and reactive to light.  Neck:     Vascular: No carotid bruit.  Cardiovascular:     Rate and Rhythm: Normal rate and regular rhythm.     Pulses: Normal pulses.     Heart sounds: Normal heart sounds. No murmur heard.    No friction rub. No gallop.  Pulmonary:     Effort: Pulmonary effort is normal. No  respiratory distress.     Breath sounds: Normal breath sounds. No wheezing, rhonchi or rales.  Chest:     Chest wall: No tenderness.  Abdominal:     General: Bowel sounds are normal. There is no distension.     Palpations: Abdomen is soft. There is no mass.     Tenderness: There is no abdominal tenderness. There is no right CVA tenderness, left CVA tenderness, guarding or rebound.  Musculoskeletal:        General: No swelling or tenderness. Normal range of motion.     Cervical back: Normal range of motion. No rigidity or tenderness.     Right lower leg: No edema.     Left lower leg: No edema.  Lymphadenopathy:     Cervical: No cervical adenopathy.  Skin:    General: Skin is warm and dry.     Coloration: Skin is not pale.     Findings: No erythema or rash.  Neurological:     Mental Status: He is alert and oriented to person, place, and time.  Cranial Nerves: No cranial nerve deficit.     Motor: No weakness.     Gait: Gait normal.  Psychiatric:        Mood and Affect: Mood normal.        Speech: Speech normal.        Behavior: Behavior normal.     Labs reviewed: Recent Labs    02/22/21 1413  NA 140  K 4.7  CL 103  CO2 29  GLUCOSE 80  BUN 13  CREATININE 1.06  CALCIUM 9.3   Recent Labs    02/22/21 1413  AST 16  ALT 11  BILITOT 0.6  PROT 6.8   Recent Labs    02/22/21 1413  WBC 2.6*  NEUTROABS 1,058*  HGB 14.4  HCT 43.5  MCV 88.2  PLT 399   Lab Results  Component Value Date   TSH 0.88 02/22/2021   No results found for: "HGBA1C" Lab Results  Component Value Date   CHOL 123 02/22/2021   HDL 45 02/22/2021   LDLCALC 65 02/22/2021   TRIG 47 02/22/2021   CHOLHDL 2.7 02/22/2021    Significant Diagnostic Results in last 30 days:  No results found.  Assessment/Plan  1. Essential hypertension B/p well controled  - continue on amlodipine  - amLODipine (NORVASC) 5 MG tablet; Take 1 tablet (5 mg total) by mouth daily.  Dispense: 90 tablet; Refill:  1 - CBC with Differential/Platelet - COMPLETE METABOLIC PANEL WITH GFR - TSH - Lipid panel  2. Screen for STD (sexually transmitted disease) No high risk factors  - HIV Antibody (routine testing w rflx)  3. Encounter for hepatitis C screening test for low risk patient Low risk  - Hep C Antibody  4. Screening for hyperlipidemia Previous LDL at goal  - Lipid panel  Family/ staff Communication: Reviewed plan of care with patient verbalized understanding   Labs/tests ordered:  - CBC with Differential/Platelet - CMP with eGFR([facility]) - TSH - Lipid panel - Hep C Antibody - HIV Antibody (routine testing w rflx)  Next Appointment: Return in about 6 months (around 06/07/2022) for medical mangement of chronic issues.Sandrea Hughs, NP

## 2021-12-05 NOTE — Patient Instructions (Signed)

## 2021-12-06 LAB — COMPLETE METABOLIC PANEL WITH GFR
AG Ratio: 1.8 (calc) (ref 1.0–2.5)
ALT: 11 U/L (ref 9–46)
AST: 15 U/L (ref 10–40)
Albumin: 4.5 g/dL (ref 3.6–5.1)
Alkaline phosphatase (APISO): 41 U/L (ref 36–130)
BUN: 15 mg/dL (ref 7–25)
CO2: 27 mmol/L (ref 20–32)
Calcium: 9.4 mg/dL (ref 8.6–10.3)
Chloride: 102 mmol/L (ref 98–110)
Creat: 1.06 mg/dL (ref 0.60–1.24)
Globulin: 2.5 g/dL (calc) (ref 1.9–3.7)
Glucose, Bld: 83 mg/dL (ref 65–99)
Potassium: 5.3 mmol/L (ref 3.5–5.3)
Sodium: 140 mmol/L (ref 135–146)
Total Bilirubin: 0.5 mg/dL (ref 0.2–1.2)
Total Protein: 7 g/dL (ref 6.1–8.1)
eGFR: 102 mL/min/{1.73_m2} (ref >=60)

## 2021-12-06 LAB — CBC WITH DIFFERENTIAL/PLATELET
Basophils Absolute: 31 cells/uL (ref 0–200)
Hemoglobin: 14.9 g/dL (ref 13.2–17.1)
MCH: 29.4 pg (ref 27.0–33.0)
MCV: 87.2 fL (ref 80.0–100.0)
Monocytes Relative: 8.3 %
WBC: 2.2 10*3/uL — ABNORMAL LOW (ref 3.8–10.8)

## 2021-12-06 LAB — LIPID PANEL
Cholesterol: 126 mg/dL (ref <200)
HDL: 52 mg/dL (ref >=40)
LDL Cholesterol (Calc): 65 mg/dL (calc)
Non-HDL Cholesterol (Calc): 74 mg/dL (calc) (ref <130)
Total CHOL/HDL Ratio: 2.4 (calc) (ref <5.0)
Triglycerides: 31 mg/dL (ref <150)

## 2021-12-06 LAB — TSH: TSH: 1.47 mIU/L (ref 0.40–4.50)

## 2021-12-06 LAB — HEPATITIS C ANTIBODY: Hepatitis C Ab: NONREACTIVE

## 2021-12-06 LAB — HIV ANTIBODY (ROUTINE TESTING W REFLEX): HIV 1&2 Ab, 4th Generation: NONREACTIVE

## 2021-12-11 DIAGNOSIS — I10 Essential (primary) hypertension: Secondary | ICD-10-CM | POA: Insufficient documentation

## 2021-12-15 ENCOUNTER — Other Ambulatory Visit: Payer: Self-pay | Admitting: Family

## 2021-12-15 DIAGNOSIS — D72819 Decreased white blood cell count, unspecified: Secondary | ICD-10-CM

## 2021-12-16 ENCOUNTER — Telehealth: Payer: Self-pay | Admitting: Oncology

## 2021-12-16 NOTE — Telephone Encounter (Signed)
Scheduled appt per 8/3 referral. Pt is aware of appt date and time. Pt is aware to arrive 15 mins prior to appt time and to bring and updated insurance card. Pt is aware of appt location.   

## 2021-12-29 NOTE — Progress Notes (Signed)
[facility] Initial Visit:  Patient Care Team: Ngetich, Nelda Bucks, NP as PCP - General (Family Medicine)  CHIEF COMPLAINTS/PURPOSE OF CONSULTATION:  Oncology History   No history exists.    HISTORY OF PRESENTING ILLNESS: Gregory Holland 21 y.o. male is here because of  leukopenia.   Patient's medical history notable for intermittent bouts of mild pharyngitis and acne in high school.  .   Labs in October 2022 and July 2023 were notable for neutropenia and monocytopenia.  Latest Reference Range & Units 02/22/21 14:13 12/05/21 09:58  WBC 3.8 - 10.8 Thousand/uL 2.6 (L) 2.2 (L)  RBC 4.20 - 5.80 Million/uL 4.93 5.07  Hemoglobin 13.2 - 17.1 g/dL 14.4 14.9  HCT 38.5 - 50.0 % 43.5 44.2  MCV 80.0 - 100.0 fL 88.2 87.2  MCH 27.0 - 33.0 pg 29.2 29.4  MCHC 32.0 - 36.0 g/dL 33.1 33.7  RDW 11.0 - 15.0 % 11.3 11.4  Platelets 140 - 400 Thousand/uL 399 302  MPV 7.5 - 12.5 fL 9.3 9.7  Neutrophils % 40.7 33.6  Monocytes Relative % 6.3 8.3  Eosinophil % 1.2 8.3  Basophil % 0.8 1.4  NEUT# 1,500 - 7,800 cells/uL 1,058 (L) 739 (L)  Lymphocyte # 850 - 3,900 cells/uL 1,326 1,065  Total Lymphocyte % 51.0 48.4  Eosinophils Absolute 15 - 500 cells/uL 31 183  Basophils Absolute 0 - 200 cells/uL 21 31  Absolute Monocytes 200 - 950 cells/uL 164 (L) 183 (L)  (L): Data is abnormally low  Additional testing notable for a negative HIV test.    Has not experienced recurrent sinopulmonary infections, UTI's.  No mouth sores.    He has no family history of blood disorders  Review of Systems  Constitutional:  Negative for chills, fatigue, fever and unexpected weight change.  HENT:   Negative for mouth sores, nosebleeds, sore throat and trouble swallowing.   Eyes: Negative.   Respiratory: Negative.    Cardiovascular: Negative.   Gastrointestinal:  Negative for abdominal distention, abdominal pain, blood in stool, constipation, diarrhea and nausea.  Genitourinary:  Negative for difficulty  urinating, dysuria, frequency and hematuria.   Musculoskeletal:  Negative for arthralgias, back pain, flank pain, myalgias and neck pain.  Skin:  Negative for itching, rash and wound.  Neurological:  Negative for headaches, numbness and seizures.  Hematological:  Negative for adenopathy. Does not bruise/bleed easily.    MEDICAL HISTORY: Past Medical History:  Diagnosis Date   Foot pain    Per [facility] New Patient Packet   High blood pressure    Per [place] New Patient Packet   Knee pain    Per [place] New Patient packet    SURGICAL HISTORY: No past surgical history on file.  SOCIAL HISTORY: Social History   Socioeconomic History   Marital status: Single    Spouse name: Not on file   Number of children: Not on file   Years of education: Not on file   Highest education level: Not on file  Occupational History   Not on file  Tobacco Use   Smoking status: Never   Smokeless tobacco: Never  Vaping Use   Vaping Use: Never used  Substance and Sexual Activity   Alcohol use: Yes    Comment: less than 2   Drug use: Never   Sexual activity: Not on file  Other Topics Concern   Not on file  Social History Narrative   Per [facility] New Patient Packet      Diet: No  Caffeine:  Left blank      Married, if yes what year: Single       Do you live in a house, apartment, assisted living, condo, trailer, ect: campus housing -apartment       Is it one or more stories: 2 stories      How many persons live in your home? 2 roommates       Pets: No       Highest level or education completed: High School       Current/Past profession: Ship broker, full-time       Exercise:          Yes        Type and how often: 2 more times a week          Living Will: No   DNR: No   POA/HPOA: No      Functional Status:   Do you have difficulty bathing or dressing yourself? No   Do you have difficulty preparing food or eating? No   Do you have difficulty managing your medications? No   Do you have  difficulty managing your finances? No   Do you have difficulty affording your medications? No   Social Determinants of Radio broadcast assistant Strain: Not on file  Food Insecurity: Not on file  Transportation Needs: Not on file  Physical Activity: Not on file  Stress: Not on file  Social Connections: Not on file  Intimate Partner Violence: Not on file    FAMILY HISTORY Family History  Problem Relation Age of Onset   High blood pressure Mother    Dementia Maternal Grandmother    Heart attack Paternal Grandmother     ALLERGIES:  is allergic to amoxicillin-pot clavulanate.  MEDICATIONS:  Current Outpatient Medications  Medication Sig Dispense Refill   acetaminophen (TYLENOL) 500 MG tablet Take 1 tablet (500 mg total) by mouth every 6 (six) hours as needed. 30 tablet 0   amLODipine (NORVASC) 5 MG tablet Take 1 tablet (5 mg total) by mouth daily. 90 tablet 1   Multiple Vitamin (MULTIVITAMIN) tablet Take 1 tablet by mouth daily.     No current facility-administered medications for this visit.    PHYSICAL EXAMINATION:  ECOG PERFORMANCE STATUS: 0 - Asymptomatic   There were no vitals filed for this visit.  There were no vitals filed for this visit.   Physical Exam Constitutional:      General: He is not in acute distress.    Appearance: Normal appearance. He is normal weight. He is not ill-appearing, toxic-appearing or diaphoretic.     Comments: Here with mother.  Healthy appearing  HENT:     Head: Normocephalic.     Nose: No congestion or rhinorrhea.     Mouth/Throat:     Mouth: Mucous membranes are moist.     Pharynx: No oropharyngeal exudate or posterior oropharyngeal erythema.  Eyes:     General: No scleral icterus.    Extraocular Movements: Extraocular movements intact.     Conjunctiva/sclera: Conjunctivae normal.     Pupils: Pupils are equal, round, and reactive to light.  Cardiovascular:     Rate and Rhythm: Normal rate and regular rhythm.     Heart  sounds: Normal heart sounds. No murmur heard.    No friction rub. No gallop.  Pulmonary:     Effort: Pulmonary effort is normal. No respiratory distress.     Breath sounds: Normal breath sounds. No wheezing or rales.  Abdominal:  General: Abdomen is flat. There is no distension.     Palpations: Abdomen is soft. There is no mass.     Tenderness: There is no abdominal tenderness. There is no guarding or rebound.     Hernia: No hernia is present.  Musculoskeletal:        General: No swelling, tenderness, deformity or signs of injury.     Right lower leg: No edema.     Left lower leg: No edema.  Skin:    General: Skin is warm and dry.     Coloration: Skin is not jaundiced or pale.     Findings: No erythema, lesion or rash.  Neurological:     General: No focal deficit present.     Mental Status: He is alert and oriented to person, place, and time.     Cranial Nerves: No cranial nerve deficit.  Psychiatric:        Mood and Affect: Mood normal.        Behavior: Behavior normal.        Thought Content: Thought content normal.        Judgment: Judgment normal.      LABORATORY DATA: I have personally reviewed the data as listed:  Office Visit on 12/05/2021  Component Date Value Ref Range Status   HIV 1&2 Ab, 4th Generation 12/05/2021 NON-REACTIVE  NON-REACTIVE Final   Comment: HIV-1 antigen and HIV-1/HIV-2 antibodies were not detected. There is no laboratory evidence of HIV infection. Marland Kitchen PLEASE NOTE: This information has been disclosed to you from records whose confidentiality may be protected by state law.  If your state requires such protection, then the state law prohibits you from making any further disclosure of the information without the specific written consent of the person to whom it pertains, or as otherwise permitted by law. A general authorization for the release of medical or other information is NOT sufficient for this purpose. . For additional information  please refer to http://education.quest[facility].com/faq/FAQ106 (This link is being provided for informational/ educational purposes only.) . Marland Kitchen The performance of this assay has not been clinically validated in patients less than 22 years old. .    Hepatitis C Ab 12/05/2021 NON-REACTIVE  NON-REACTIVE Final   Comment: . HCV antibody was non-reactive. There is no laboratory  evidence of HCV infection. . In most cases, no further action is required. However, if recent HCV exposure is suspected, a test for HCV RNA (test code [phone]) is suggested. . For additional information please refer to http://education.quest[facility].com/faq/FAQ22v1 (This link is being provided for informational/ educational purposes only.) .    WBC 12/05/2021 2.2 (L)  3.8 - 10.8 Thousand/uL Final   RBC 12/05/2021 5.07  4.20 - 5.80 Million/uL Final   Hemoglobin 12/05/2021 14.9  13.2 - 17.1 g/dL Final   HCT 12/05/2021 44.2  38.5 - 50.0 % Final   MCV 12/05/2021 87.2  80.0 - 100.0 fL Final   MCH 12/05/2021 29.4  27.0 - 33.0 pg Final   MCHC 12/05/2021 33.7  32.0 - 36.0 g/dL Final   RDW 12/05/2021 11.4  11.0 - 15.0 % Final   Platelets 12/05/2021 302  140 - 400 Thousand/uL Final   MPV 12/05/2021 9.7  7.5 - 12.5 fL Final   Neutro Abs 12/05/2021 739 (L)  1,500 - 7,800 cells/uL Final   Lymphs Abs 12/05/2021 1,065  850 - 3,900 cells/uL Final   Absolute Monocytes 12/05/2021 183 (L)  200 - 950 cells/uL Final   Eosinophils Absolute 12/05/2021 183  15 -  500 cells/uL Final   Basophils Absolute 12/05/2021 31  0 - 200 cells/uL Final   Neutrophils Relative % 12/05/2021 33.6  % Final   Total Lymphocyte 12/05/2021 48.4  % Final   Monocytes Relative 12/05/2021 8.3  % Final   Eosinophils Relative 12/05/2021 8.3  % Final   Basophils Relative 12/05/2021 1.4  % Final   Glucose, Bld 12/05/2021 83  65 - 99 mg/dL Final   Comment: .            Fasting reference interval .    BUN 12/05/2021 15  7 - 25 mg/dL Final   Creat  12/05/2021 1.06  0.60 - 1.24 mg/dL Final   eGFR 12/05/2021 102  > OR = 60 mL/min/1.6m Final   Comment: The eGFR is based on the CKD-EPI 2021 equation. To calculate  the new eGFR from a previous Creatinine or Cystatin C result, go to https://www.kidney.org/professionals/ kdoqi/gfr%5Fcalculator    BUN/Creatinine Ratio 079/89/2119NOT APPLICABLE  6 - 22 (calc) Final   Sodium 12/05/2021 140  135 - 146 mmol/L Final   Potassium 12/05/2021 5.3  3.5 - 5.3 mmol/L Final   Chloride 12/05/2021 102  98 - 110 mmol/L Final   CO2 12/05/2021 27  20 - 32 mmol/L Final   Calcium 12/05/2021 9.4  8.6 - 10.3 mg/dL Final   Total Protein 12/05/2021 7.0  6.1 - 8.1 g/dL Final   Albumin 12/05/2021 4.5  3.6 - 5.1 g/dL Final   Globulin 12/05/2021 2.5  1.9 - 3.7 g/dL (calc) Final   AG Ratio 12/05/2021 1.8  1.0 - 2.5 (calc) Final   Total Bilirubin 12/05/2021 0.5  0.2 - 1.2 mg/dL Final   Alkaline phosphatase (APISO) 12/05/2021 41  36 - 130 U/L Final   AST 12/05/2021 15  10 - 40 U/L Final   ALT 12/05/2021 11  9 - 46 U/L Final   TSH 12/05/2021 1.47  0.40 - 4.50 mIU/L Final   Cholesterol 12/05/2021 126  <200 mg/dL Final   HDL 12/05/2021 52  > OR = 40 mg/dL Final   Triglycerides 12/05/2021 31  <150 mg/dL Final   LDL Cholesterol (Calc) 12/05/2021 65  mg/dL (calc) Final   Comment: Reference range: <100 . Desirable range <100 mg/dL for primary prevention;   <70 mg/dL for patients with CHD or diabetic patients  with > or = 2 CHD risk factors. .Marland KitchenLDL-C is now calculated using the Martin-Hopkins  calculation, which is a validated novel method providing  better accuracy than the Friedewald equation in the  estimation of LDL-C.  MCresenciano Genreet al. JAnnamaria Helling [id number];081(44: [id number]  (http://education.[facility].com/faq/FAQ164)    Total CHOL/HDL Ratio 12/05/2021 2.4  <5.0 (calc) Final   Non-HDL Cholesterol (Calc) 12/05/2021 74  <130 mg/dL (calc) Final   Comment: For patients with diabetes plus 1 major ASCVD risk  factor,  treating to a non-HDL-C goal of <100 mg/dL  (LDL-C of <70 mg/dL) is considered a therapeutic  option.      No results found.  ASSESSMENT/PLAN  Neutropenia Causes Causes Examples   Congentital Benign Ethnic Pure white cell aplasia Storage Disease ELA2 mutation ELANE related neutropenia Kostman  Shawachman-Diamond Syndrome Barth Syndrome Severe congenital neutropenia X-linked agammaglobinemia Storage diseases GATA2 deficiency   Infectious  Viral Measles, mumps, roseola, rubella, RSV, influenza,  Hepatitis A, B, C CMV, EBV,  Parvovirus   Bacterial  Tuberculosis Burcellosis [place] Typhoid   Rikettsial [place] Spotted fever Erlichiosis   Fungal Histoplasmosis   Parasitic Malaria Leishmaniasis  Autoimmune Primary Autoimmune  Thymoma SLE, RA Felty's syndrome Crohn's Disease Evans Syndrome   Nutritional Deficiencies B12 Folate Copper Protein calorie malnutrition   Hematologic Malignancies T-cell LGL Myeloid neoplasms PNH   Medications Chemotherapy Non-chemotherapy   Bone marrow failure Aplastic anemia PNH    Given lack of other cytopenias, lack of systemic symptoms and lack of recurrent infections  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  Another possibility, given his age is an inherited disorder.  I will therefore obtain CBC with diff, B12, folate, copper, zinc, congenital neutropenia panel.  If negative can consider additional testing.       Cancer Staging  No matching staging information was found for the patient.   No problem-specific Assessment & Plan notes found for this encounter.   No orders of the defined types were placed in this encounter.   All questions were answered. The patient knows to call the clinic with any problems, questions or concerns.  This note was electronically signed.    Barbee Cough, MD  12/29/2021 9:53 AM

## 2021-12-30 ENCOUNTER — Inpatient Hospital Stay: Payer: 59

## 2021-12-30 ENCOUNTER — Other Ambulatory Visit: Payer: Self-pay

## 2021-12-30 ENCOUNTER — Inpatient Hospital Stay: Payer: 59 | Attending: Oncology | Admitting: Oncology

## 2021-12-30 DIAGNOSIS — D708 Other neutropenia: Secondary | ICD-10-CM

## 2021-12-30 DIAGNOSIS — D709 Neutropenia, unspecified: Secondary | ICD-10-CM | POA: Insufficient documentation

## 2021-12-30 LAB — COMPREHENSIVE METABOLIC PANEL
ALT: 13 U/L (ref 0–44)
AST: 19 U/L (ref 15–41)
Albumin: 4.5 g/dL (ref 3.5–5.0)
Alkaline Phosphatase: 40 U/L (ref 38–126)
Anion gap: 3 — ABNORMAL LOW (ref 5–15)
BUN: 16 mg/dL (ref 6–20)
CO2: 32 mmol/L (ref 22–32)
Calcium: 9.6 mg/dL (ref 8.9–10.3)
Chloride: 102 mmol/L (ref 98–111)
Creatinine, Ser: 1 mg/dL (ref 0.61–1.24)
GFR, Estimated: >60 mL/min (ref >60)
Glucose, Bld: 93 mg/dL (ref 70–99)
Potassium: 4.4 mmol/L (ref 3.5–5.1)
Sodium: 137 mmol/L (ref 135–145)
Total Bilirubin: 0.4 mg/dL (ref 0.3–1.2)
Total Protein: 6.7 g/dL (ref 6.5–8.1)

## 2021-12-30 LAB — CBC WITH DIFFERENTIAL/PLATELET
Abs Immature Granulocytes: 0 10*3/uL (ref 0.00–0.07)
Basophils Absolute: 0 10*3/uL (ref 0.0–0.1)
Basophils Relative: 1 %
Eosinophils Absolute: 0 10*3/uL (ref 0.0–0.5)
Eosinophils Relative: 2 %
HCT: 40.9 % (ref 39.0–52.0)
Hemoglobin: 13.8 g/dL (ref 13.0–17.0)
Immature Granulocytes: 0 %
Lymphocytes Relative: 45 %
Lymphs Abs: 1.2 10*3/uL (ref 0.7–4.0)
MCH: 29.1 pg (ref 26.0–34.0)
MCHC: 33.7 g/dL (ref 30.0–36.0)
MCV: 86.3 fL (ref 80.0–100.0)
Monocytes Absolute: 0.2 10*3/uL (ref 0.1–1.0)
Monocytes Relative: 6 %
Neutro Abs: 1.2 10*3/uL — ABNORMAL LOW (ref 1.7–7.7)
Neutrophils Relative %: 46 %
Platelets: 297 10*3/uL (ref 150–400)
RBC: 4.74 MIL/uL (ref 4.22–5.81)
RDW: 10.9 % — ABNORMAL LOW (ref 11.5–15.5)
WBC: 2.7 10*3/uL — ABNORMAL LOW (ref 4.0–10.5)
nRBC: 0 % (ref 0.0–0.2)

## 2021-12-30 LAB — TECHNOLOGIST SMEAR REVIEW: Plt Morphology: NORMAL

## 2022-01-04 LAB — COPPER, SERUM: Copper: 91 ug/dL (ref 63–121)

## 2022-01-04 LAB — ZINC: Zinc: 76 ug/dL (ref 44–115)

## 2022-03-01 ENCOUNTER — Ambulatory Visit: Payer: 59 | Admitting: Family

## 2022-04-24 ENCOUNTER — Encounter: Payer: Self-pay | Admitting: Adult Health

## 2022-04-24 ENCOUNTER — Ambulatory Visit (INDEPENDENT_AMBULATORY_CARE_PROVIDER_SITE_OTHER): Payer: 59 | Admitting: Adult Health

## 2022-04-24 VITALS — BP 120/80 | HR 68 | Temp 98.0°F | Resp 18 | Ht 74.0 in | Wt 174.1 lb

## 2022-04-24 DIAGNOSIS — B360 Pityriasis versicolor: Secondary | ICD-10-CM

## 2022-04-24 DIAGNOSIS — I10 Essential (primary) hypertension: Secondary | ICD-10-CM

## 2022-04-24 MED ORDER — KETOCONAZOLE 2 % EX SHAM: 1.0000 | MEDICATED_SHAMPOO | CUTANEOUS | 0 refills | Status: DC

## 2022-04-24 NOTE — Progress Notes (Unsigned)
[facility] clinic  Provider:   Code Status: *** Goals of Care:     12/05/2021    9:41 AM  Advanced Directives  Does Patient Have a Medical Advance Directive? No  Would patient like information on creating a medical advance directive? No - Patient declined     Chief Complaint  Patient presents with   Acute Visit    Patient here for rash on chest and neck    HPI: Patient is a 21 y.o. male seen today for an acute visit for  Started 2 months ago, Whitish/blackish discoloration, Not itching   Past Medical History:  Diagnosis Date   Foot pain    Per [facility] New Patient Packet   High blood pressure    Per [facility] New Patient Packet   Knee pain    Per [facility] New Patient packet    History reviewed. No pertinent surgical history.  Allergies  Allergen Reactions   Amoxicillin-Pot Clavulanate Rash    Outpatient Encounter Medications as of 04/24/2022  Medication Sig   acetaminophen (TYLENOL) 500 MG tablet Take 1 tablet (500 mg total) by mouth every 6 (six) hours as needed.   amLODipine (NORVASC) 5 MG tablet Take 1 tablet (5 mg total) by mouth daily.   Multiple Vitamin (MULTIVITAMIN) tablet Take 1 tablet by mouth daily.   No facility-administered encounter medications on file as of 04/24/2022.    Review of Systems:  Review of Systems  Constitutional:  Negative for activity change, appetite change and fever.  HENT:  Negative for sore throat.   Eyes: Negative.   Cardiovascular:  Negative for chest pain and leg swelling.  Gastrointestinal:  Negative for abdominal distention, diarrhea and vomiting.  Genitourinary:  Negative for dysuria, frequency and urgency.  Skin:  Negative for color change.  Neurological:  Negative for dizziness and headaches.  Psychiatric/Behavioral:  Negative for behavioral problems and sleep disturbance. The patient is not nervous/anxious.     Health Maintenance  Topic Date Due   INFLUENZA VACCINE  12/13/2021   COVID-19 Vaccine (5 - 2023-24 season)  01/13/2022   DTaP/Tdap/Td (8 - Td or Tdap) 02/01/2030   HPV VACCINES  Completed   Hepatitis C Screening  Completed   HIV Screening  Completed    Physical Exam: Vitals:   04/24/22 1509  BP: 120/80  Pulse: 68  Resp: 18  Temp: 98 F (36.7 C)  SpO2: 98%  Weight: 174 lb 2 oz (79 kg)  Height: 6\' 2"  (1.88 m)   Body mass index is 22.36 kg/m. Physical Exam Constitutional:      Appearance: Normal appearance.  HENT:     Head: Normocephalic and atraumatic.     Mouth/Throat:     Mouth: Mucous membranes are moist.  Eyes:     Conjunctiva/sclera: Conjunctivae normal.  Cardiovascular:     Rate and Rhythm: Normal rate and regular rhythm.     Pulses: Normal pulses.     Heart sounds: Normal heart sounds.  Pulmonary:     Effort: Pulmonary effort is normal.     Breath sounds: Normal breath sounds.  Abdominal:     General: Bowel sounds are normal.     Palpations: Abdomen is soft.  Musculoskeletal:        General: No swelling. Normal range of motion.     Cervical back: Normal range of motion.  Skin:    General: Skin is warm and dry.     Comments: Whitish/blackish blotches on bilateral neck and right front and back of trunk.  Neurological:     General: No focal deficit present.     Mental Status: He is alert and oriented to person, place, and time.  Psychiatric:        Mood and Affect: Mood normal.        Behavior: Behavior normal.        Thought Content: Thought content normal.        Judgment: Judgment normal.     Labs reviewed: Basic Metabolic Panel: Recent Labs    12/05/21 0958 12/30/21 1123  NA 140 137  K 5.3 4.4  CL 102 102  CO2 27 32  GLUCOSE 83 93  BUN 15 16  CREATININE 1.06 1.00  CALCIUM 9.4 9.6  TSH 1.47  --    Liver Function Tests: Recent Labs    12/05/21 0958 12/30/21 1123  AST 15 19  ALT 11 13  ALKPHOS  --  40  BILITOT 0.5 0.4  PROT 7.0 6.7  ALBUMIN  --  4.5   No results for input(s): "LIPASE", "AMYLASE" in the last 8760 hours. No results for  input(s): "AMMONIA" in the last 8760 hours. CBC: Recent Labs    12/05/21 0958 12/30/21 1123  WBC 2.2* 2.7*  NEUTROABS 739* 1.2*  HGB 14.9 13.8  HCT 44.2 40.9  MCV 87.2 86.3  PLT 302 297   Lipid Panel: Recent Labs    12/05/21 0958  CHOL 126  HDL 52  LDLCALC 65  TRIG 31  CHOLHDL 2.4   No results found for: "HGBA1C"  Procedures since last visit: No results found.  Assessment/Plan    Labs/tests ordered:  no labs  Next appt:  06/07/2022

## 2022-04-24 NOTE — Patient Instructions (Signed)
Tinea Versicolor  Tinea versicolor is a skin infection. It is caused by a type of yeast. It is normal for some yeast to be on your skin, but too much yeast causes this infection. The infection causes a rash of light or dark patches on your skin. The rash is most common on the chest, back, neck, or upper arms. The infection usually does not cause other problems. If it is treated, it will probably go away in a few weeks. The infection cannot be spread from one person to another (is notcontagious). What are the causes? This condition is caused by a certain type of yeast that starts to grow too much on your skin. What increases the risk? Heat and humidity. Sweating too much. Hormone changes. This may happen when taking birth control pills. Oily skin. A weak disease-fighting system (immunesystem). What are the signs or symptoms? A rash of light or dark patches on your skin. The rash may have: Patches of tan or pink spots (on light skin). Patches of white or Hixon spots (on dark skin). Patches of skin that do not tan. Well-marked edges. Scales. Mild itching. There may also be no itching. How is this treated? Treatment for this condition may include: Dandruff shampoo. The shampoo may be used on the affected skin during showers or baths. Over-the-counter medicated skin cream, lotion, or soaps. Prescription antifungal medicine. This may include cream or pills. Medicine to help your itching. Follow these instructions at home: Use over-the-counter and prescription medicines only as told by your doctor. Wash your skin with dandruff shampoo as told by your doctor. Do not scratch your skin in the rash area. Avoid places that are hot and humid. Do not use tanning booths. Try to avoid sweating a lot. Contact a doctor if: Your symptoms get worse. You have a fever. You have signs of infection such as: Redness, swelling, or pain in the rash area. Warmth coming from your rash. Fluid or blood  coming from your rash. Pus or a bad smell coming from your rash. Your rash comes back (recurs) after treatment. Your rash does not improve with treatment. Your rash spreads to other parts of the body. Summary Tinea versicolor is a skin infection. It causes a rash of light or dark patches on your skin. The rash is most common on the chest, back, neck, or upper arms. This infection usually does not cause other problems. Use over-the-counter and prescription medicines only as told by your doctor. If the infection is treated, it will probably go away in a few weeks. This information is not intended to replace advice given to you by your health care provider. Make sure you discuss any questions you have with your health care provider. Document Revised: 07/20/2020 Document Reviewed: 07/20/2020 Elsevier Patient Education  2023 Elsevier Inc.  

## 2022-05-14 ENCOUNTER — Ambulatory Visit (INDEPENDENT_AMBULATORY_CARE_PROVIDER_SITE_OTHER): Payer: 59

## 2022-05-14 ENCOUNTER — Encounter (HOSPITAL_COMMUNITY): Payer: Self-pay | Admitting: *Deleted

## 2022-05-14 ENCOUNTER — Ambulatory Visit (HOSPITAL_COMMUNITY)
Admission: EM | Admit: 2022-05-14 | Discharge: 2022-05-14 | Disposition: A | Discharge status: Home / Self Care | Payer: 59 | Attending: Emergency Medicine | Admitting: Emergency Medicine

## 2022-05-14 ENCOUNTER — Other Ambulatory Visit: Payer: Self-pay

## 2022-05-14 DIAGNOSIS — R112 Nausea with vomiting, unspecified: Secondary | ICD-10-CM | POA: Diagnosis not present

## 2022-05-14 DIAGNOSIS — R0602 Shortness of breath: Secondary | ICD-10-CM

## 2022-05-14 DIAGNOSIS — R197 Diarrhea, unspecified: Secondary | ICD-10-CM

## 2022-05-14 MED ORDER — ONDANSETRON 8 MG PO TBDP: ORAL_TABLET | ORAL | 0 refills | Status: DC

## 2022-05-14 NOTE — Discharge Instructions (Signed)
Your x-ray was negative for pneumonia.  Try the Zofran.  This can make you constipated, but will slow down the diarrhea.  Push electrolyte containing fluids such as Pedialyte, liquid IV.  You can take Tylenol 1000 mg 3 times a day as needed.  You can also try some Maalox.  Follow-up with your primary care provider if not getting better in several days, go to the ER if you get worse or for any concerns.

## 2022-05-14 NOTE — ED Triage Notes (Signed)
Since Wed. Pt reports diarrhea  with constipation ,fatigue pressure in throat.

## 2022-05-14 NOTE — ED Provider Notes (Signed)
HPI  SUBJECTIVE:  Gregory Holland is a 21 y.o. male who presents with 5 days of nausea, multiple episodes of nonbilious, nonbloody emesis, malaise, fatigue, decreased appetite, generalized weakness.  States that he was having difficulty tolerating p.o. up until today.  He reports watery, nonbloody diarrhea starting today.  Had mild epigastric pain during this time, but this has mostly resolved.  He reports decreased urine output.  He is he reports substernal chest tightness with waterbrash and shortness of breath primarily after vomiting.  He states he feels as if his lungs are "not opening up" and reports occasional wheezing.  He denies fevers, body aches, headaches, nasal congestion, rhinorrhea, sore throat, cough, no raw or undercooked foods, questionable leftovers, recent travel, recent antibiotics, change in medications.  No known COVID, flu exposure.  He had 3-4 doses of the COVID-vaccine and this years flu vaccine.  His girlfriend recently had a similar GI illness.  No antipyretic in the past 6 hours.  He has tried Advil cold and flu with improvement in his symptoms.  Symptoms are worse with being up for prolonged periods of time.  He has a past medical history of hypertension.  No history of diabetes, abdominal surgeries, peptic ulcer disease, pulmonary disease, smoking, PE, DVT.Marland Kitchen  PCP: [facility]    Past Medical History:  Diagnosis Date   Foot pain    Per [place] New Patient Packet   High blood pressure    Per [place] New Patient Packet   Knee pain    Per [facility] New Patient packet    History reviewed. No pertinent surgical history.  Family History  Problem Relation Age of Onset   High blood pressure Mother    Dementia Maternal Grandmother    Heart attack Paternal Grandmother     Social History   Tobacco Use   Smoking status: Never   Smokeless tobacco: Never  Vaping Use   Vaping Use: Never used  Substance Use Topics   Alcohol use: Yes    Comment: less than 2   Drug use:  Never    No current facility-administered medications for this encounter.  Current Outpatient Medications:    ondansetron (ZOFRAN-ODT) 8 MG disintegrating tablet, 1/2- 1 tablet q 8 hr prn nausea, vomiting, Disp: 20 tablet, Rfl: 0   acetaminophen (TYLENOL) 500 MG tablet, Take 1 tablet (500 mg total) by mouth every 6 (six) hours as needed., Disp: 30 tablet, Rfl: 0   amLODipine (NORVASC) 5 MG tablet, Take 1 tablet (5 mg total) by mouth daily., Disp: 90 tablet, Rfl: 1   ketoconazole (NIZORAL) 2 % shampoo, Apply 1 Application topically 2 (two) times a week., Disp: 120 mL, Rfl: 0   Multiple Vitamin (MULTIVITAMIN) tablet, Take 1 tablet by mouth daily., Disp: , Rfl:   Allergies  Allergen Reactions   Amoxicillin-Pot Clavulanate Rash     ROS  As noted in HPI.   Physical Exam  BP (!) 154/92   Pulse 85   Temp 98.5 F (36.9 C)   Resp 18   SpO2 98%   Constitutional: Well developed, well nourished, no acute distress Eyes: PERRL, EOMI, conjunctiva normal bilaterally HENT: Normocephalic, atraumatic,mucus membranes moist Respiratory: Clear to auscultation bilaterally, no rales, no wheezing, no rhonchi Cardiovascular: Normal rate and rhythm, no murmurs, no gallops, no rubs GI: Soft, nondistended, normal bowel sounds, mild epigastric,  left upper quadrant tenderness, no rebound, no guarding Back: no CVAT skin: No rash, skin intact Musculoskeletal:  calves symmetric, nontender, no edema Neurologic: Alert &  oriented x 3, CN III-XII grossly intact, no motor deficits, sensation grossly intact Psychiatric: Speech and behavior appropriate   ED Course   Medications - No data to display  Orders Placed This Encounter  Procedures   DG Chest 2 View    Standing Status:   Standing    Number of Occurrences:   1    Order Specific Question:   Reason for Exam (SYMPTOM  OR DIAGNOSIS REQUIRED)    Answer:   vomiting, SOB, r/o aspiration PNA   No results found for this or any previous visit (from the  past 24 hour(s)). DG Chest 2 View  Result Date: 05/14/2022 CLINICAL DATA:  Vomiting short of breath EXAM: CHEST - 2 VIEW COMPARISON:  04/17/2005 FINDINGS: The heart size and mediastinal contours are within normal limits. Both lungs are clear. The visualized skeletal structures are unremarkable. IMPRESSION: No active cardiopulmonary disease. Electronically Signed   By: Jasmine Pang M.D.   On: 05/14/2022 19:23    ED Clinical Impression  1. Nausea vomiting and diarrhea      ED Assessment/Plan     Presentation consistent with a viral illness/gastroenteritis.  He has some epigastric and left upper quadrant tenderness suggestive of a gastritis.  We discussed testing for COVID, but patient is fully immunized, his only risk factor is hypertension.  Would not get the test back until it is too late to do anything about it, so he and the family have decided to defer COVID testing today.  Patient does not appear to be significantly dehydrated, his vitals were normal, deferring lab testing today.  Will check chest x-ray as he is reporting some chest tightness and shortness of breath after vomiting to evaluate for an aspiration pneumonia.  Reviewed imaging independently.  No pneumonia.  See radiology report for full details.  I suspect that patient's chest tightness and shortness of breath is esophageal irritation from repeated episodes of vomiting.  Chest x-ray is negative for pneumonia.  Doubt PE, ACS.  Sending home with Zofran, push electrolyte containing fluids, Tylenol 1000 mg 3 times a day, Maalox.  Work note for 2 days.  Strict ER return precautions given.  Discussed imaging, MDM, treatment plan, and plan for follow-up with patient, parent. Discussed sn/sx that should prompt return to the ED. they agree with plan.   Meds ordered this encounter  Medications   ondansetron (ZOFRAN-ODT) 8 MG disintegrating tablet    Sig: 1/2- 1 tablet q 8 hr prn nausea, vomiting    Dispense:  20 tablet    Refill:   0      *This clinic note was created using Scientist, clinical (histocompatibility and immunogenetics). Therefore, there may be occasional mistakes despite careful proofreading. ?    Domenick Gong, MD 05/15/22 1351

## 2022-06-07 ENCOUNTER — Ambulatory Visit: Payer: 59 | Admitting: Family

## 2022-07-30 ENCOUNTER — Other Ambulatory Visit: Payer: Self-pay | Admitting: Family

## 2022-07-30 DIAGNOSIS — I10 Essential (primary) hypertension: Secondary | ICD-10-CM

## 2022-09-10 ENCOUNTER — Encounter (HOSPITAL_COMMUNITY): Payer: Self-pay

## 2022-09-10 ENCOUNTER — Ambulatory Visit (HOSPITAL_COMMUNITY)
Admission: EM | Admit: 2022-09-10 | Discharge: 2022-09-10 | Disposition: A | Discharge status: Home / Self Care | Payer: 59 | Attending: Internal Medicine | Admitting: Internal Medicine

## 2022-09-10 DIAGNOSIS — M25512 Pain in left shoulder: Secondary | ICD-10-CM | POA: Diagnosis not present

## 2022-09-10 DIAGNOSIS — S0003XA Contusion of scalp, initial encounter: Secondary | ICD-10-CM | POA: Diagnosis not present

## 2022-09-10 DIAGNOSIS — S0511XA Contusion of eyeball and orbital tissues, right eye, initial encounter: Secondary | ICD-10-CM

## 2022-09-10 MED ORDER — METHOCARBAMOL 500 MG PO TABS: 500.0000 mg | ORAL_TABLET | Freq: Two times a day (BID) | ORAL | 0 refills | Status: DC
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
## 2022-12-05 ENCOUNTER — Ambulatory Visit: Payer: 59 | Admitting: Family

## 2022-12-07 ENCOUNTER — Ambulatory Visit (INDEPENDENT_AMBULATORY_CARE_PROVIDER_SITE_OTHER): Payer: 59 | Admitting: Family

## 2022-12-07 ENCOUNTER — Encounter: Payer: Self-pay | Admitting: Family

## 2022-12-07 VITALS — BP 128/80 | HR 63 | Temp 97.3°F | Resp 16 | Ht 74.0 in | Wt 179.0 lb

## 2022-12-07 DIAGNOSIS — Z0000 Encounter for general adult medical examination without abnormal findings: Secondary | ICD-10-CM | POA: Diagnosis not present

## 2022-12-07 DIAGNOSIS — I10 Essential (primary) hypertension: Secondary | ICD-10-CM | POA: Diagnosis not present

## 2022-12-07 LAB — CBC WITH DIFFERENTIAL/PLATELET
Absolute Monocytes: 158 cells/uL — ABNORMAL LOW (ref 200–950)
Basophils Absolute: 19 cells/uL (ref 0–200)
Basophils Relative: 0.9 %
Eosinophils Absolute: 59 cells/uL (ref 15–500)
Eosinophils Relative: 2.8 %
HCT: 41.3 % (ref 38.5–50.0)
Hemoglobin: 14 g/dL (ref 13.2–17.1)
Lymphs Abs: 1233 cells/uL (ref 850–3900)
MCH: 29.5 pg (ref 27.0–33.0)
MCHC: 33.9 g/dL (ref 32.0–36.0)
MCV: 87.1 fL (ref 80.0–100.0)
MPV: 9.6 fL (ref 7.5–12.5)
Monocytes Relative: 7.5 %
Neutro Abs: 632 cells/uL — ABNORMAL LOW (ref 1500–7800)
Neutrophils Relative %: 30.1 %
Platelets: 313 10*3/uL (ref 140–400)
RBC: 4.74 10*6/uL (ref 4.20–5.80)
RDW: 11.3 % (ref 11.0–15.0)
Total Lymphocyte: 58.7 %
WBC: 2.1 10*3/uL — ABNORMAL LOW (ref 3.8–10.8)

## 2022-12-07 MED ORDER — AMLODIPINE BESYLATE 5 MG PO TABS: 5.0000 mg | ORAL_TABLET | Freq: Every day | ORAL | 0 refills | Status: DC

## 2022-12-07 NOTE — Patient Instructions (Signed)
Health Maintenance, Male Adopting a healthy lifestyle and getting preventive care are important in promoting health and wellness. Ask your health care provider about: The right schedule for you to have regular tests and exams. Things you can do on your own to prevent diseases and keep yourself healthy. What should I know about diet, weight, and exercise? Eat a healthy diet  Eat a diet that includes plenty of vegetables, fruits, low-fat dairy products, and lean protein. Do not eat a lot of foods that are high in solid fats, added sugars, or sodium. Maintain a healthy weight Body mass index (BMI) is a measurement that can be used to identify possible weight problems. It estimates body fat based on height and weight. Your health care provider can help determine your BMI and help you achieve or maintain a healthy weight. Get regular exercise Get regular exercise. This is one of the most important things you can do for your health. Most adults should: Exercise for at least 150 minutes each week. The exercise should increase your heart rate and make you sweat (moderate-intensity exercise). Do strengthening exercises at least twice a week. This is in addition to the moderate-intensity exercise. Spend less time sitting. Even light physical activity can be beneficial. Watch cholesterol and blood lipids Have your blood tested for lipids and cholesterol at 24 years of age, then have this test every 5 years. You may need to have your cholesterol levels checked more often if: Your lipid or cholesterol levels are high. You are older than 24 years of age. You are at high risk for heart disease. What should I know about cancer screening? Many types of cancers can be detected early and may often be prevented. Depending on your health history and family history, you may need to have cancer screening at various ages. This may include screening for: Colorectal cancer. Prostate cancer. Skin cancer. Lung  cancer. What should I know about heart disease, diabetes, and high blood pressure? Blood pressure and heart disease High blood pressure causes heart disease and increases the risk of stroke. This is more likely to develop in people who have high blood pressure readings or are overweight. Talk with your health care provider about your target blood pressure readings. Have your blood pressure checked: Every 3-5 years if you are 89-50 years of age. Every year if you are 25 years old or older. If you are between the ages of 17 and 66 and are a current or former smoker, ask your health care provider if you should have a one-time screening for abdominal aortic aneurysm (AAA). Diabetes Have regular diabetes screenings. This checks your fasting blood sugar level. Have the screening done: Once every three years after age 46 if you are at a normal weight and have a low risk for diabetes. More often and at a younger age if you are overweight or have a high risk for diabetes. What should I know about preventing infection? Hepatitis B If you have a higher risk for hepatitis B, you should be screened for this virus. Talk with your health care provider to find out if you are at risk for hepatitis B infection. Hepatitis C Blood testing is recommended for: Everyone born from [birth year] through 1967. Anyone with known risk factors for hepatitis C. Sexually transmitted infections (STIs) You should be screened each year for STIs, including gonorrhea and chlamydia, if: You are sexually active and are younger than 24 years of age. You are older than 24 years of age and your  health care provider tells you that you are at risk for this type of infection. Your sexual activity has changed since you were last screened, and you are at increased risk for chlamydia or gonorrhea. Ask your health care provider if you are at risk. Ask your health care provider about whether you are at high risk for HIV. Your health care provider  may recommend a prescription medicine to help prevent HIV infection. If you choose to take medicine to prevent HIV, you should first get tested for HIV. You should then be tested every 3 months for as long as you are taking the medicine. Follow these instructions at home: Alcohol use Do not drink alcohol if your health care provider tells you not to drink. If you drink alcohol: Limit how much you have to 0-2 drinks a day. Know how much alcohol is in your drink. In the [place] one drink equals one 12 oz bottle of beer (355 mL), one 5 oz glass of wine (148 mL), or one 1 oz glass of hard liquor (44 mL). Lifestyle Do not use any products that contain nicotine or tobacco. These products include cigarettes, chewing tobacco, and vaping devices, such as e-cigarettes. If you need help quitting, ask your health care provider. Do not use street drugs. Do not share needles. Ask your health care provider for help if you need support or information about quitting drugs. General instructions Schedule regular health, dental, and eye exams. Stay current with your vaccines. Tell your health care provider if: You often feel depressed. You have ever been abused or do not feel safe at home. Summary Adopting a healthy lifestyle and getting preventive care are important in promoting health and wellness. Follow your health care provider's instructions about healthy diet, exercising, and getting tested or screened for diseases. Follow your health care provider's instructions on monitoring your cholesterol and blood pressure. This information is not intended to replace advice given to you by your health care provider. Make sure you discuss any questions you have with your health care provider. Document Revised: 09/20/2020 Document Reviewed: 09/20/2020 Elsevier Patient Education  2024 Elsevier Inc.     Why follow it? Research shows. Those who follow the Mediterranean diet have a reduced risk of heart disease  The  diet is associated with a reduced incidence of Parkinson's and Alzheimer's diseases People following the diet may have longer life expectancies and lower rates of chronic diseases  The Dietary Guidelines for Americans recommends the Mediterranean diet as an eating plan to promote health and prevent disease  What Is the Mediterranean Diet?  Healthy eating plan based on typical foods and recipes of Mediterranean-style cooking The diet is primarily a plant based diet; these foods should make up a majority of meals   Starches - Plant based foods should make up a majority of meals - They are an important sources of vitamins, minerals, energy, antioxidants, and fiber - Choose whole grains, foods high in fiber and minimally processed items  - Typical grain sources include wheat, oats, barley, corn, Srinivasan rice, bulgar, farro, millet, polenta, couscous  - Various types of beans include chickpeas, lentils, fava beans, black beans, white beans   Fruits  Veggies - Large quantities of antioxidant rich fruits & veggies; 6 or more servings  - Vegetables can be eaten raw or lightly drizzled with oil and cooked  - Vegetables common to the traditional Mediterranean Diet include: artichokes, arugula, beets, broccoli, brussel sprouts, cabbage, carrots, celery, collard greens, cucumbers, eggplant, kale, leeks,  lemons, lettuce, mushrooms, okra, onions, peas, peppers, potatoes, pumpkin, radishes, rutabaga, shallots, spinach, sweet potatoes, turnips, zucchini - Fruits common to the Mediterranean Diet include: apples, apricots, avocados, cherries, clementines, dates, figs, grapefruits, grapes, melons, nectarines, oranges, peaches, pears, pomegranates, strawberries, tangerines  Fats - Replace butter and margarine with healthy oils, such as olive oil, canola oil, and tahini  - Limit nuts to no more than a handful a day  - Nuts include walnuts, almonds, pecans, pistachios, pine nuts  - Limit or avoid candied, honey roasted  or heavily salted nuts - Olives are central to the [employer] - can be eaten whole or used in a variety of dishes   Meats Protein - Limiting red meat: no more than a few times a month - When eating red meat: choose lean cuts and keep the portion to the size of deck of cards - Eggs: approx. 0 to 4 times a week  - Fish and lean poultry: at least 2 a week  - Healthy protein sources include, chicken, [place], lean beef, lamb - Increase intake of seafood such as tuna, salmon, trout, mackerel, shrimp, scallops - Avoid or limit high fat processed meats such as sausage and bacon  Dairy - Include moderate amounts of low fat dairy products  - Focus on healthy dairy such as fat free yogurt, skim milk, low or reduced fat cheese - Limit dairy products higher in fat such as whole or 2% milk, cheese, ice cream  Alcohol - Moderate amounts of red wine is ok  - No more than 5 oz daily for women (all ages) and men older than age 25  - No more than 10 oz of wine daily for men younger than 37  Other - Limit sweets and other desserts  - Use herbs and spices instead of salt to flavor foods  - Herbs and spices common to the traditional Mediterranean Diet include: basil, bay leaves, chives, cloves, cumin, fennel, garlic, lavender, marjoram, mint, oregano, parsley, pepper, rosemary, sage, savory, sumac, tarragon, thyme   It's not just a diet, it's a lifestyle:  The Mediterranean diet includes lifestyle factors typical of those in the region  Foods, drinks and meals are best eaten with others and savored Daily physical activity is important for overall good health This could be strenuous exercise like running and aerobics This could also be more leisurely activities such as walking, housework, yard-work, or taking the stairs Moderation is the key; a balanced and healthy diet accommodates most foods and drinks Consider portion sizes and frequency of consumption of certain foods   Meal Ideas & Options:   Breakfast:  Whole wheat toast or whole wheat English muffins with peanut butter & hard boiled egg Steel cut oats topped with apples & cinnamon and skim milk  Fresh fruit: banana, strawberries, melon, berries, peaches  Smoothies: strawberries, bananas, greek yogurt, peanut butter Low fat greek yogurt with blueberries and granola  Egg white omelet with spinach and mushrooms Breakfast couscous: whole wheat couscous, apricots, skim milk, cranberries  Sandwiches:  Hummus and grilled vegetables (peppers, zucchini, squash) on whole wheat bread   Grilled chicken on whole wheat pita with lettuce, tomatoes, cucumbers or tzatziki  [place] salad on whole wheat bread: tuna salad made with greek yogurt, olives, red peppers, capers, green onions Garlic rosemary lamb pita: lamb sauted with garlic, rosemary, salt & pepper; add lettuce, cucumber, greek yogurt to pita - flavor with lemon juice and black pepper  Seafood:  Mediterranean grilled salmon, seasoned with garlic,  basil, parsley, lemon juice and black pepper Shrimp, lemon, and spinach whole-grain pasta salad made with low fat greek yogurt  Seared scallops with lemon orzo  Seared tuna steaks seasoned salt, pepper, coriander topped with tomato mixture of olives, tomatoes, olive oil, minced garlic, parsley, green onions and cappers  Meats:  Herbed greek chicken salad with kalamata olives, cucumber, feta  Red bell peppers stuffed with spinach, bulgur, lean ground beef (or lentils) & topped with feta   Kebabs: skewers of chicken, tomatoes, onions, zucchini, squash  [place] burgers: made with red onions, mint, dill, lemon juice, feta cheese topped with roasted red peppers Vegetarian Cucumber salad: cucumbers, artichoke hearts, celery, red onion, feta cheese, tossed in olive oil & lemon juice  Hummus and whole grain pita points with a greek salad (lettuce, tomato, feta, olives, cucumbers, red onion) Lentil soup with celery, carrots made with vegetable broth,  garlic, salt and pepper  Tabouli salad: parsley, bulgur, mint, scallions, cucumbers, tomato, radishes, lemon juice, olive oil, salt and pepper.

## 2022-12-07 NOTE — Progress Notes (Signed)
Provider: Richarda Blade FNP-C   Kalayna Noy, Donalee Citrin, NP  Patient Care Team: Roger Fasnacht, Donalee Citrin, NP as PCP - General (Family Medicine) Loni Muse, MD as Attending Physician (Hematology and Oncology)  Extended Emergency Contact Information Primary Emergency Contact: [facility] Address: [address]          [place],  [place] Home Phone: [phone] Relation: Mother Secondary Emergency Contact: Turri,Clyde Mobile Phone: [phone] Relation: Father Interpreter needed? No  Code Status:  Full Code  Goals of care: Advanced Directive information    12/05/2021    9:41 AM  Advanced Directives  Does Patient Have a Medical Advance Directive? No  Would patient like information on creating a medical advance directive? No - Patient declined     Chief Complaint  Patient presents with   Annual Exam    Patient is here for CPE   Quality Metric Gaps    Patient due for updated covid booster    HPI:  Pt is a 22 y.o. male seen today for Annual physical examination and  medical management of chronic diseases.   He denies any acute issues.States recently graduated will be moving to [facility] to start new Job.congratulated. Due for COVID-19 booster vaccine.   Past Medical History:  Diagnosis Date   Foot pain    Per [facility] New Patient Packet   High blood pressure    Per [facility] New Patient Packet   Knee pain    Per [facility] New Patient packet   History reviewed. No pertinent surgical history.  Allergies  Allergen Reactions   Amoxicillin-Pot Clavulanate Rash    Allergies as of 12/07/2022       Reactions   Amoxicillin-pot Clavulanate Rash        Medication List        Accurate as of December 07, 2022  9:05 AM. If you have any questions, ask your nurse or doctor.          acetaminophen 500 MG tablet Commonly known as: TYLENOL Take 1 tablet (500 mg total) by mouth every 6 (six) hours as needed.   amLODipine 5 MG tablet Commonly known as: NORVASC TAKE 1 TABLET(5 MG)  BY MOUTH DAILY   ketoconazole 2 % shampoo Commonly known as: NIZORAL Apply 1 Application topically 2 (two) times a week.   methocarbamol 500 MG tablet Commonly known as: ROBAXIN Take 1 tablet (500 mg total) by mouth 2 (two) times daily.   multivitamin tablet Take 1 tablet by mouth daily.   ondansetron 8 MG disintegrating tablet Commonly known as: ZOFRAN-ODT 1/2- 1 tablet q 8 hr prn nausea, vomiting        Review of Systems  Constitutional:  Negative for appetite change, chills, fatigue, fever and unexpected weight change.  HENT:  Negative for congestion, dental problem, ear discharge, ear pain, facial swelling, hearing loss, nosebleeds, postnasal drip, rhinorrhea, sinus pressure, sinus pain, sneezing, sore throat, tinnitus and trouble swallowing.   Eyes:  Negative for pain, discharge, redness, itching and visual disturbance.  Respiratory:  Negative for cough, chest tightness, shortness of breath and wheezing.   Cardiovascular:  Negative for chest pain, palpitations and leg swelling.  Gastrointestinal:  Negative for abdominal distention, abdominal pain, blood in stool, constipation, diarrhea, nausea and vomiting.  Endocrine: Negative for cold intolerance, heat intolerance, polydipsia, polyphagia and polyuria.  Genitourinary:  Negative for difficulty urinating, dysuria, flank pain, frequency and urgency.  Musculoskeletal:  Negative for arthralgias, back pain, gait problem, joint swelling, myalgias, neck pain and neck stiffness.  Skin:  Negative for color change, pallor, rash and wound.  Neurological:  Negative for dizziness, syncope, speech difficulty, weakness, light-headedness, numbness and headaches.  Hematological:  Does not bruise/bleed easily.  Psychiatric/Behavioral:  Negative for agitation, behavioral problems, confusion, hallucinations, self-injury, sleep disturbance and suicidal ideas. The patient is not nervous/anxious.     Immunization History  Administered Date(s)  Administered   DTaP 03/21/1999, 05/27/1999, 07/15/1999, 07/16/2000, 01/28/2003   H1N1 03/16/2008   HIB (PRP-T) 03/21/1999, 05/27/1999, 07/15/1999, 01/23/2000   HPV Quadrivalent 05/10/2011, 07/17/2011, 12/18/2011   Hepatitis A, Ped/Adol-2 Dose 04/23/2006, 04/29/2007   Hepatitis B, PED/ADOLESCENT 04/24/99, 02/15/1999, 10/21/1999   IPV 03/21/1999, 05/27/1999, 07/15/1999, 01/28/2003   Influenza Nasal 02/10/2010, 05/09/2012, 04/30/2013   Influenza Split 01/27/2015   Influenza,inj,Quad PF,6+ Mos 02/04/2016, 02/05/2017, 02/22/2021   MMR 01/23/2000, 01/28/2003   Meningococcal Conjugate 05/02/2010, 01/27/2015   Novel Infuenza-h1n1-09 03/16/2008   PFIZER(Purple Top)SARS-COV-2 Vaccination 07/29/2019, 08/15/2019, 04/15/2020, 02/26/2021   Pneumococcal Conjugate PCV 7 01/23/2000, 07/16/2000   Pneumococcal Conjugate-13 01/23/2000, 07/16/2000   Td (Adult),5 Lf Tetanus Toxid, Preservative Free 02/03/2009   Tdap 02/03/2009, 02/02/2020   Varicella 01/23/2000, 04/23/2006   Pertinent  Health Maintenance Due  Topic Date Due   INFLUENZA VACCINE  12/14/2022      02/22/2021    1:18 PM 11/14/2021   11:53 AM 12/05/2021    9:41 AM 12/30/2021   10:14 AM 05/14/2022    6:40 PM  Fall Risk  Falls in the past year? 0 0 0    Was there an injury with Fall? 0 0 0    Fall Risk Category Calculator 0 0 0    Fall Risk Category (Retired) Low Low Low    (RETIRED) Patient Fall Risk Level Low fall risk Low fall risk Low fall risk Low fall risk Low fall risk  Patient at Risk for Falls Due to No Fall Risks No Fall Risks No Fall Risks    Fall risk Follow up Falls evaluation completed Falls evaluation completed Falls evaluation completed     Functional Status Survey:    Vitals:   12/07/22 0854  BP: 128/80  Pulse: 63  Resp: 16  Temp: (!) 97.3 F (36.3 C)  SpO2: 99%  Weight: 179 lb (81.2 kg)  Height: 6\' 2"  (1.88 m)   Body mass index is 22.98 kg/m. Physical Exam Vitals reviewed.  Constitutional:      General:  He is not in acute distress.    Appearance: Normal appearance. He is normal weight. He is not ill-appearing or diaphoretic.  HENT:     Head: Normocephalic.     Right Ear: Tympanic membrane, ear canal and external ear normal. There is no impacted cerumen.     Left Ear: Tympanic membrane, ear canal and external ear normal. There is no impacted cerumen.     Nose: Nose normal. No congestion or rhinorrhea.     Mouth/Throat:     Mouth: Mucous membranes are moist.     Pharynx: Oropharynx is clear. No oropharyngeal exudate or posterior oropharyngeal erythema.  Eyes:     General: No scleral icterus.       Right eye: No discharge.        Left eye: No discharge.     Extraocular Movements: Extraocular movements intact.     Conjunctiva/sclera: Conjunctivae normal.     Pupils: Pupils are equal, round, and reactive to light.  Neck:     Vascular: No carotid bruit.  Cardiovascular:     Rate and Rhythm: Normal rate and regular  rhythm.     Pulses: Normal pulses.     Heart sounds: Normal heart sounds. No murmur heard.    No friction rub. No gallop.  Pulmonary:     Effort: Pulmonary effort is normal. No respiratory distress.     Breath sounds: Normal breath sounds. No wheezing, rhonchi or rales.  Chest:     Chest wall: No tenderness.  Abdominal:     General: Bowel sounds are normal. There is no distension.     Palpations: Abdomen is soft. There is no mass.     Tenderness: There is no abdominal tenderness. There is no right CVA tenderness, left CVA tenderness, guarding or rebound.  Musculoskeletal:        General: No swelling or tenderness. Normal range of motion.     Cervical back: Normal range of motion. No rigidity or tenderness.     Right lower leg: No edema.     Left lower leg: No edema.  Lymphadenopathy:     Cervical: No cervical adenopathy.  Skin:    General: Skin is warm and dry.     Coloration: Skin is not pale.     Findings: No bruising, erythema, lesion or rash.  Neurological:      Mental Status: He is alert and oriented to person, place, and time.     Cranial Nerves: No cranial nerve deficit.     Sensory: No sensory deficit.     Motor: No weakness.     Coordination: Coordination normal.     Gait: Gait normal.  Psychiatric:        Mood and Affect: Mood normal.        Speech: Speech normal.        Behavior: Behavior normal.        Thought Content: Thought content normal.        Judgment: Judgment normal.     Labs reviewed: Recent Labs    12/30/21 1123  NA 137  K 4.4  CL 102  CO2 32  GLUCOSE 93  BUN 16  CREATININE 1.00  CALCIUM 9.6   Recent Labs    12/30/21 1123  AST 19  ALT 13  ALKPHOS 40  BILITOT 0.4  PROT 6.7  ALBUMIN 4.5   Recent Labs    12/30/21 1123  WBC 2.7*  NEUTROABS 1.2*  HGB 13.8  HCT 40.9  MCV 86.3  PLT 297   Lab Results  Component Value Date   TSH 1.47 12/05/2021   No results found for: "HGBA1C" Lab Results  Component Value Date   CHOL 126 12/05/2021   HDL 52 12/05/2021   LDLCALC 65 12/05/2021   TRIG 31 12/05/2021   CHOLHDL 2.4 12/05/2021    Significant Diagnostic Results in last 30 days:  No results found.  Assessment/Plan 1. Essential hypertension -Blood pressure at goal -Continue with dietary modification and exercise -Continue on amlodipine 5 mg tablet daily  2. Annual physical exam - Up to date with immunization except COVID-19 booster vaccine aware to get in the pharmacy.  - Medication and labs reviewed patient counselled regarding yearly exam, prevention of dental and periodontal disease, diet, regular sustained exercise for at least 30 minutes x 3 /week,COVID-19 hand hygiene, mask and social distancing per CDC guidelines.  -  recommended fasting routine labs today.  -  PHQ 2 up dated.  Family/ staff Communication: Reviewed plan of care with patient verbalized understanding  Labs/tests ordered:  - CBC with Differential/Platelet - CMP with eGFR([facility])  Next Appointment :  Return in 1 year (on  12/07/2023) for annual Physical examination.   Caesar Bookman, NP

## 2023-03-23 ENCOUNTER — Other Ambulatory Visit: Payer: Self-pay | Admitting: Family

## 2023-03-23 DIAGNOSIS — I10 Essential (primary) hypertension: Secondary | ICD-10-CM

## 2023-09-23 ENCOUNTER — Other Ambulatory Visit: Payer: Self-pay | Admitting: Family

## 2023-09-23 DIAGNOSIS — I10 Essential (primary) hypertension: Secondary | ICD-10-CM

## 2023-10-29 ENCOUNTER — Ambulatory Visit (INDEPENDENT_AMBULATORY_CARE_PROVIDER_SITE_OTHER): Payer: Self-pay | Admitting: Family

## 2023-10-29 ENCOUNTER — Encounter: Payer: Self-pay | Admitting: Family

## 2023-10-29 VITALS — BP 132/80 | HR 64 | Temp 97.8°F | Resp 20 | Ht 74.0 in | Wt 189.6 lb

## 2023-10-29 DIAGNOSIS — R251 Tremor, unspecified: Secondary | ICD-10-CM | POA: Diagnosis not present

## 2023-10-29 DIAGNOSIS — I10 Essential (primary) hypertension: Secondary | ICD-10-CM | POA: Diagnosis not present

## 2023-10-29 NOTE — Progress Notes (Signed)
 Provider: Christean Courts FNP-C  Glen Kesinger, Elijio Guadeloupe, NP  Patient Care Team: Turner Baillie, Elijio Guadeloupe, NP as PCP - General (Family Medicine) Sidman Hammans, MD (Inactive) as Attending Physician (Hematology and Oncology)  Extended Emergency Contact Information Primary Emergency Contact: Clonch,TINA Address: [address]          [place] [address] Home Phone: [phone] Relation: Mother Secondary Emergency Contact: Savich,Clyde Mobile Phone: [phone] Relation: Father Interpreter needed? No  Code Status: Full code Goals of care: Advanced Directive information    10/29/2023    2:26 PM  Advanced Directives  Does Patient Have a Medical Advance Directive? No  Would patient like information on creating a medical advance directive? No - Patient declined     Chief Complaint  Patient presents with   Fatigue    Discussed the use of AI scribe software for clinical note transcription with the patient, who gave verbal consent to proceed.  History of Present Illness   Gregory Holland is a 23 year old male who presents with persistent tremors and a recent history of a dry cough. He is here with his mother.   He has been experiencing tremors in his hands for about a year and a half. The tremors occur randomly and can affect either a finger or the entire hand, particularly noticeable when eating with a spoon. There are no tremors in his head or other parts of his body. No associated numbness, tingling, or cramping in his hands or feet. He has a family history of Parkinson's-like symptoms, with his grandfather having had Parkinson's disease and his father exhibiting similar symptoms without a formal diagnosis.  He experienced a dry cough that persisted for approximately three and a half to four weeks, ending about a week ago. The cough was primarily in his throat and was accompanied by nasal congestion at one point. He did not test for COVID-19 during this period. Initially, he felt  miserable and slightly tired for the first couple of days, but he reports feeling fine now with no sore throat.  He is currently taking blood pressure medication, which he obtains from a [facility] in [place] . He has not had any lab work done in the past six months, but previous tests included checks for vitamin B12, magnesium, and electrolytes. He resides in [place]  and has gained weight since his last visit, now weighing 189 pounds, up from 179 pounds. He reports a good appetite and no recent changes in his energy levels.   Past Medical History:  Diagnosis Date   Foot pain    Per [facility] New Patient Packet   High blood pressure    Per [facility] New Patient Packet   Knee pain    Per [facility] New Patient packet   History reviewed. No pertinent surgical history.  Allergies  Allergen Reactions   Amoxicillin-Pot Clavulanate Rash    Outpatient Encounter Medications as of 10/29/2023  Medication Sig   acetaminophen  (TYLENOL ) 500 MG tablet Take 1 tablet (500 mg total) by mouth every 6 (six) hours as needed.   amLODipine  (NORVASC ) 5 MG tablet TAKE 1 TABLET(5 MG) BY MOUTH DAILY   ascorbic acid (VITAMIN C) 500 MG tablet Take 500 mg by mouth daily.   Multiple Vitamin (MULTIVITAMIN) tablet Take 1 tablet by mouth daily.   No facility-administered encounter medications on file as of 10/29/2023.    Review of Systems  Constitutional:  Positive for fatigue. Negative for appetite change, chills, fever and unexpected weight change.  HENT:  Negative for congestion,  dental problem, ear discharge, ear pain, facial swelling, hearing loss, nosebleeds, postnasal drip, rhinorrhea, sinus pressure, sinus pain, sneezing, sore throat, tinnitus and trouble swallowing.   Eyes:  Negative for pain, discharge, redness, itching and visual disturbance.  Respiratory:  Negative for cough, chest tightness, shortness of breath and wheezing.   Cardiovascular:  Negative for chest pain, palpitations and leg swelling.  Gastrointestinal:  Negative  for abdominal distention, abdominal pain, blood in stool, constipation, diarrhea, nausea and vomiting.  Endocrine: Negative for cold intolerance, heat intolerance, polydipsia, polyphagia and polyuria.  Genitourinary:  Negative for difficulty urinating, dysuria, flank pain, frequency and urgency.  Musculoskeletal:  Negative for arthralgias, back pain, gait problem, joint swelling, myalgias, neck pain and neck stiffness.  Skin:  Negative for color change, pallor, rash and wound.  Neurological:  Positive for tremors. Negative for dizziness, syncope, speech difficulty, weakness, light-headedness, numbness and headaches.  Hematological:  Does not bruise/bleed easily.  Psychiatric/Behavioral:  Negative for agitation, behavioral problems, confusion, hallucinations, self-injury, sleep disturbance and suicidal ideas. The patient is not nervous/anxious.     Immunization History  Administered Date(s) Administered   DTaP 03/21/1999, 05/27/1999, 07/15/1999, 07/16/2000, 01/28/2003   H1N1 03/16/2008   HIB (PRP-T) 03/21/1999, 05/27/1999, 07/15/1999, 01/23/2000   HPV Quadrivalent 05/10/2011, 07/17/2011, 12/18/2011   Hepatitis A, Ped/Adol-2 Dose 04/23/2006, 04/29/2007   Hepatitis B, PED/ADOLESCENT 08-28-1998, 02/15/1999, 10/21/1999   IPV 03/21/1999, 05/27/1999, 07/15/1999, 01/28/2003   Influenza Nasal 02/10/2010, 05/09/2012, 04/30/2013   Influenza Split 01/27/2015   Influenza,inj,Quad PF,6+ Mos 02/04/2016, 02/05/2017, 02/22/2021   MMR 01/23/2000, 01/28/2003   Meningococcal Conjugate 05/02/2010, 01/27/2015   Novel Infuenza-h1n1-09 03/16/2008   PFIZER(Purple Top)SARS-COV-2 Vaccination 07/29/2019, 08/15/2019, 04/15/2020, 02/26/2021   Pneumococcal Conjugate PCV 7 01/23/2000, 07/16/2000   Pneumococcal Conjugate-13 01/23/2000, 07/16/2000   Td (Adult),5 Lf Tetanus Toxid, Preservative Free 02/03/2009   Tdap 02/03/2009, 02/02/2020   Varicella 01/23/2000, 04/23/2006   Pertinent  Health Maintenance Due  Topic  Date Due   INFLUENZA VACCINE  12/14/2023      11/14/2021   11:53 AM 12/05/2021    9:41 AM 12/30/2021   10:14 AM 05/14/2022    6:40 PM 10/29/2023    2:26 PM  Fall Risk  Falls in the past year? 0 0   0  Was there an injury with Fall? 0 0   0  Fall Risk Category Calculator 0 0   0  Fall Risk Category (Retired) Low  Low      (RETIRED) Patient Fall Risk Level Low fall risk  Low fall risk  Low fall risk  Low fall risk    Patient at Risk for Falls Due to No Fall Risks No Fall Risks   No Fall Risks  Fall risk Follow up Falls evaluation completed  Falls evaluation completed    Falls evaluation completed     Data saved with a previous flowsheet row definition   Functional Status Survey:    Vitals:   10/29/23 1432  BP: 132/80  Pulse: 64  Resp: 20  Temp: 97.8 F (36.6 C)  SpO2: 98%  Weight: 189 lb 9.6 oz (86 kg)  Height: 6' 2 (1.88 m)   Body mass index is 24.34 kg/m. Physical Exam Vitals reviewed.  Constitutional:      General: He is not in acute distress.    Appearance: Normal appearance. He is normal weight. He is not ill-appearing or diaphoretic.  HENT:     Head: Normocephalic.     Right Ear: Tympanic membrane, ear canal and external ear normal. There  is no impacted cerumen.     Left Ear: Tympanic membrane, ear canal and external ear normal. There is no impacted cerumen.     Nose: Nose normal. No congestion or rhinorrhea.     Mouth/Throat:     Mouth: Mucous membranes are moist.     Pharynx: Oropharynx is clear. No oropharyngeal exudate or posterior oropharyngeal erythema.   Eyes:     General: No scleral icterus.       Right eye: No discharge.        Left eye: No discharge.     Extraocular Movements: Extraocular movements intact.     Conjunctiva/sclera: Conjunctivae normal.     Pupils: Pupils are equal, round, and reactive to light.   Neck:     Vascular: No carotid bruit.   Cardiovascular:     Rate and Rhythm: Normal rate and regular rhythm.     Pulses: Normal  pulses.     Heart sounds: Normal heart sounds. No murmur heard.    No friction rub. No gallop.  Pulmonary:     Effort: Pulmonary effort is normal. No respiratory distress.     Breath sounds: Normal breath sounds. No wheezing, rhonchi or rales.  Chest:     Chest wall: No tenderness.  Abdominal:     General: Bowel sounds are normal. There is no distension.     Palpations: Abdomen is soft. There is no mass.     Tenderness: There is no abdominal tenderness. There is no right CVA tenderness, left CVA tenderness, guarding or rebound.   Musculoskeletal:        General: No swelling or tenderness. Normal range of motion.     Cervical back: Normal range of motion. No rigidity or tenderness.     Right lower leg: No edema.     Left lower leg: No edema.  Lymphadenopathy:     Cervical: No cervical adenopathy.   Skin:    General: Skin is warm and dry.     Coloration: Skin is not pale.     Findings: No bruising, erythema, lesion or rash.   Neurological:     Mental Status: He is alert and oriented to person, place, and time.     Cranial Nerves: No cranial nerve deficit.     Sensory: No sensory deficit.     Motor: No weakness.     Coordination: Coordination normal.     Gait: Gait normal.     Comments: Bilateral hand tremors   Psychiatric:        Mood and Affect: Mood normal.        Speech: Speech normal.        Behavior: Behavior normal.        Thought Content: Thought content normal.        Judgment: Judgment normal.    Labs reviewed: Recent Labs    12/07/22 0932  NA 139  K 4.7  CL 104  CO2 30  GLUCOSE 83  BUN 15  CREATININE 1.08  CALCIUM 9.5   Recent Labs    12/07/22 0932  AST 15  ALT 12  BILITOT 0.6  PROT 6.5   Recent Labs    12/07/22 0932  WBC 2.1*  NEUTROABS 632*  HGB 14.0  HCT 41.3  MCV 87.1  PLT 313   Lab Results  Component Value Date   TSH 1.47 12/05/2021   No results found for: HGBA1C Lab Results  Component Value Date   CHOL 126 12/05/2021    HDL  52 12/05/2021   LDLCALC 65 12/05/2021   TRIG 31 12/05/2021   CHOLHDL 2.4 12/05/2021    Significant Diagnostic Results in last 30 days:  No results found.  Assessment/Plan    Tremors Intermittent hand tremors for the past 18 months with a family history of Parkinson's-like symptoms in grandfather and father. No other neurological symptoms such as head tremors, numbness, or tingling. Coordination is normal. Differential diagnosis includes essential tremor and Parkinson's disease. Specialist evaluation is crucial due to the difficulty in pinpointing the exact cause. - Refer to a neurologist for further evaluation - Order lab work including vitamin B12, magnesium, and electrolytes to rule out other causes  Hypertension Blood pressure is well-controlled at 132/80 mmHg with medication. Continuing antihypertensive medication is essential to prevent complications such as myocardial infarction, cerebrovascular accident, and renal damage. Discontinuation risks include elevated blood pressure and associated complications. - Continue current antihypertensive medication - Regularly monitor blood pressure   Family/ staff Communication: Reviewed plan of care with patient and mother verbalized understanding  Labs/tests ordered:  - CBC with Differential/Platelet - CMP with eGFR([facility]) - magnesium level  - Vitamin B 12  Next Appointment: Return for Fasting labs in the morning.   Total time: 20 minutes. Greater than 50% of total time spent doing patient education regarding ,HTN,Tremors,health maintenance including symptom/medication management.   Estil Heman, NP

## 2023-10-30 ENCOUNTER — Other Ambulatory Visit

## 2023-10-31 ENCOUNTER — Ambulatory Visit: Payer: Self-pay | Admitting: Family

## 2023-10-31 LAB — CBC WITH DIFFERENTIAL/PLATELET
Absolute Lymphocytes: 1368 {cells}/uL (ref 850–3900)
Absolute Monocytes: 154 {cells}/uL — ABNORMAL LOW (ref 200–950)
Basophils Absolute: 22 {cells}/uL (ref 0–200)
Basophils Relative: 0.9 %
Eosinophils Absolute: 50 {cells}/uL (ref 15–500)
Eosinophils Relative: 2.1 %
HCT: 43.5 % (ref 38.5–50.0)
Hemoglobin: 13.8 g/dL (ref 13.2–17.1)
MCH: 28.9 pg (ref 27.0–33.0)
MCHC: 31.7 g/dL — ABNORMAL LOW (ref 32.0–36.0)
MCV: 91 fL (ref 80.0–100.0)
MPV: 9.6 fL (ref 7.5–12.5)
Monocytes Relative: 6.4 %
Neutro Abs: 806 {cells}/uL — ABNORMAL LOW (ref 1500–7800)
Neutrophils Relative %: 33.6 %
Platelets: 302 10*3/uL (ref 140–400)
RBC: 4.78 10*6/uL (ref 4.20–5.80)
RDW: 11.2 % (ref 11.0–15.0)
Total Lymphocyte: 57 %
WBC: 2.4 10*3/uL — ABNORMAL LOW (ref 3.8–10.8)

## 2023-10-31 LAB — COMPLETE METABOLIC PANEL WITHOUT GFR
AG Ratio: 2 (calc) (ref 1.0–2.5)
ALT: 15 U/L (ref 9–46)
AST: 14 U/L (ref 10–40)
Albumin: 4.4 g/dL (ref 3.6–5.1)
Alkaline phosphatase (APISO): 34 U/L — ABNORMAL LOW (ref 36–130)
BUN: 17 mg/dL (ref 7–25)
CO2: 29 mmol/L (ref 20–32)
Calcium: 9.3 mg/dL (ref 8.6–10.3)
Chloride: 104 mmol/L (ref 98–110)
Creat: 1.24 mg/dL (ref 0.60–1.24)
Globulin: 2.2 g/dL (ref 1.9–3.7)
Glucose, Bld: 87 mg/dL (ref 65–99)
Potassium: 4.5 mmol/L (ref 3.5–5.3)
Sodium: 140 mmol/L (ref 135–146)
Total Bilirubin: 0.5 mg/dL (ref 0.2–1.2)
Total Protein: 6.6 g/dL (ref 6.1–8.1)

## 2023-10-31 LAB — VITAMIN B12: Vitamin B-12: 579 pg/mL (ref 200–1100)

## 2023-10-31 LAB — MAGNESIUM: Magnesium: 1.9 mg/dL (ref 1.5–2.5)

## 2023-11-01 ENCOUNTER — Other Ambulatory Visit: Payer: Self-pay

## 2023-11-01 MED ORDER — MAGNESIUM GLYCINATE 100 MG PO CAPS: 100.0000 mg | ORAL_CAPSULE | Freq: Every day | ORAL | 1 refills | Status: AC
